# Patient Record
Sex: Female | Born: 1950 | Race: White | Hispanic: No | Marital: Married | Smoking: Former smoker
Health system: Southern US, Community
[De-identification: ages and names within clinical notes are randomized; demographics above are authoritative.]

## PROBLEM LIST (undated history)

## (undated) DIAGNOSIS — N200 Calculus of kidney: Secondary | ICD-10-CM

## (undated) DIAGNOSIS — K851 Biliary acute pancreatitis without necrosis or infection: Secondary | ICD-10-CM

## (undated) DIAGNOSIS — E669 Obesity, unspecified: Secondary | ICD-10-CM

## (undated) DIAGNOSIS — A159 Respiratory tuberculosis unspecified: Secondary | ICD-10-CM

## (undated) DIAGNOSIS — R011 Cardiac murmur, unspecified: Secondary | ICD-10-CM

## (undated) DIAGNOSIS — K219 Gastro-esophageal reflux disease without esophagitis: Secondary | ICD-10-CM

## (undated) DIAGNOSIS — I10 Essential (primary) hypertension: Secondary | ICD-10-CM

## (undated) DIAGNOSIS — L719 Rosacea, unspecified: Secondary | ICD-10-CM

## (undated) DIAGNOSIS — I499 Cardiac arrhythmia, unspecified: Secondary | ICD-10-CM

## (undated) DIAGNOSIS — T466X5A Adverse effect of antihyperlipidemic and antiarteriosclerotic drugs, initial encounter: Secondary | ICD-10-CM

## (undated) DIAGNOSIS — E785 Hyperlipidemia, unspecified: Secondary | ICD-10-CM

## (undated) DIAGNOSIS — H469 Unspecified optic neuritis: Secondary | ICD-10-CM

## (undated) HISTORY — PX: CHOLECYSTECTOMY: SHX55

## (undated) HISTORY — PX: EYE MUSCLE SURGERY: SHX370

## (undated) HISTORY — PX: ABDOMINAL HYSTERECTOMY: SHX81

## (undated) HISTORY — PX: OOPHORECTOMY: SHX86

---

## 1971-10-18 DIAGNOSIS — A159 Respiratory tuberculosis unspecified: Secondary | ICD-10-CM

## 1971-10-18 HISTORY — DX: Respiratory tuberculosis unspecified: A15.9

## 2011-08-03 ENCOUNTER — Ambulatory Visit: Payer: Self-pay | Admitting: Internal Medicine

## 2012-08-03 ENCOUNTER — Ambulatory Visit: Payer: Self-pay | Admitting: Internal Medicine

## 2013-07-30 ENCOUNTER — Emergency Department: Payer: Self-pay | Admitting: Emergency Medicine

## 2013-07-30 LAB — CBC WITH DIFFERENTIAL/PLATELET
Basophil #: 0 10*3/uL (ref 0.0–0.1)
Basophil %: 0.2 %
Eosinophil #: 0.4 10*3/uL (ref 0.0–0.7)
Lymphocyte #: 2.4 10*3/uL (ref 1.0–3.6)
Lymphocyte %: 29 %
Monocyte %: 7.7 %
Neutrophil #: 4.9 10*3/uL (ref 1.4–6.5)
Neutrophil %: 58.7 %

## 2013-07-30 LAB — URINALYSIS, COMPLETE
Bilirubin,UR: NEGATIVE
Blood: NEGATIVE
Glucose,UR: NEGATIVE mg/dL (ref 0–75)
Ketone: NEGATIVE
Leukocyte Esterase: NEGATIVE
Nitrite: NEGATIVE
Ph: 5 (ref 4.5–8.0)
Protein: NEGATIVE
RBC,UR: 2 /HPF (ref 0–5)
Specific Gravity: 1.017 (ref 1.003–1.030)
WBC UR: 1 /HPF (ref 0–5)

## 2013-07-30 LAB — CBC
HGB: 13.3 g/dL (ref 12.0–16.0)
MCH: 29.5 pg (ref 26.0–34.0)
MCHC: 33.8 g/dL (ref 32.0–36.0)
MCV: 87 fL (ref 80–100)
RBC: 4.52 10*6/uL (ref 3.80–5.20)
RDW: 13 % (ref 11.5–14.5)
WBC: 8.3 10*3/uL (ref 3.6–11.0)

## 2013-07-31 LAB — COMPREHENSIVE METABOLIC PANEL
Alkaline Phosphatase: 96 U/L (ref 50–136)
BUN: 15 mg/dL (ref 7–18)
Bilirubin,Total: 0.3 mg/dL (ref 0.2–1.0)
Chloride: 107 mmol/L (ref 98–107)
Co2: 26 mmol/L (ref 21–32)
Creatinine: 0.92 mg/dL (ref 0.60–1.30)
EGFR (African American): >60
EGFR (Non-African Amer.): >60
Glucose: 100 mg/dL — ABNORMAL HIGH (ref 65–99)
Osmolality: 284 (ref 275–301)
Potassium: 3.2 mmol/L — ABNORMAL LOW (ref 3.5–5.1)
SGOT(AST): 24 U/L (ref 15–37)
Sodium: 142 mmol/L (ref 136–145)

## 2013-08-01 LAB — URINE CULTURE

## 2013-08-05 ENCOUNTER — Ambulatory Visit: Payer: Self-pay | Admitting: Internal Medicine

## 2013-09-03 ENCOUNTER — Emergency Department: Payer: Self-pay | Admitting: Emergency Medicine

## 2013-09-03 LAB — URINALYSIS, COMPLETE
Bacteria: NONE SEEN
Blood: NEGATIVE
Glucose,UR: NEGATIVE mg/dL (ref 0–75)
Leukocyte Esterase: NEGATIVE
Ph: 7 (ref 4.5–8.0)
Protein: NEGATIVE
Specific Gravity: 1.013 (ref 1.003–1.030)
Squamous Epithelial: 1

## 2013-09-03 LAB — CBC WITH DIFFERENTIAL/PLATELET
Basophil #: 0.1 10*3/uL (ref 0.0–0.1)
Basophil %: 1.1 %
Eosinophil #: 0.1 10*3/uL (ref 0.0–0.7)
Eosinophil %: 1.7 %
HCT: 41.3 % (ref 35.0–47.0)
Lymphocyte %: 31.5 %
MCHC: 33.5 g/dL (ref 32.0–36.0)
Monocyte #: 0.4 x10 3/mm (ref 0.2–0.9)
Monocyte %: 6.6 %
Neutrophil #: 3.2 10*3/uL (ref 1.4–6.5)
Neutrophil %: 59.1 %
Platelet: 174 10*3/uL (ref 150–440)
RBC: 4.72 10*6/uL (ref 3.80–5.20)
RDW: 13.2 % (ref 11.5–14.5)
WBC: 5.4 10*3/uL (ref 3.6–11.0)

## 2013-09-03 LAB — BASIC METABOLIC PANEL
Anion Gap: 7 (ref 7–16)
BUN: 12 mg/dL (ref 7–18)
Calcium, Total: 9 mg/dL (ref 8.5–10.1)
Chloride: 108 mmol/L — ABNORMAL HIGH (ref 98–107)
Co2: 25 mmol/L (ref 21–32)
Creatinine: 0.93 mg/dL (ref 0.60–1.30)
EGFR (African American): >60
EGFR (Non-African Amer.): >60
Glucose: 125 mg/dL — ABNORMAL HIGH (ref 65–99)
Potassium: 3.8 mmol/L (ref 3.5–5.1)

## 2013-09-03 LAB — TROPONIN I
Troponin-I: <0.02 ng/mL
Troponin-I: <0.02 ng/mL

## 2014-07-13 ENCOUNTER — Emergency Department: Payer: Self-pay | Admitting: Emergency Medicine

## 2014-07-13 LAB — COMPREHENSIVE METABOLIC PANEL
ALK PHOS: 78 U/L
ALT: 42 U/L
Albumin: 3.4 g/dL (ref 3.4–5.0)
Anion Gap: 7 (ref 7–16)
BUN: 13 mg/dL (ref 7–18)
Bilirubin,Total: 0.4 mg/dL (ref 0.2–1.0)
CHLORIDE: 108 mmol/L — AB (ref 98–107)
CO2: 27 mmol/L (ref 21–32)
Calcium, Total: 8.7 mg/dL (ref 8.5–10.1)
Creatinine: 1 mg/dL (ref 0.60–1.30)
EGFR (African American): >60
EGFR (Non-African Amer.): 60 — ABNORMAL LOW
Glucose: 99 mg/dL (ref 65–99)
Osmolality: 283 (ref 275–301)
Potassium: 3.2 mmol/L — ABNORMAL LOW (ref 3.5–5.1)
SGOT(AST): 33 U/L (ref 15–37)
Sodium: 142 mmol/L (ref 136–145)
TOTAL PROTEIN: 7.1 g/dL (ref 6.4–8.2)

## 2014-07-13 LAB — CBC
HCT: 43.6 % (ref 35.0–47.0)
HGB: 13.9 g/dL (ref 12.0–16.0)
MCH: 28.2 pg (ref 26.0–34.0)
MCHC: 31.9 g/dL — AB (ref 32.0–36.0)
MCV: 88 fL (ref 80–100)
Platelet: 188 10*3/uL (ref 150–440)
RBC: 4.94 10*6/uL (ref 3.80–5.20)
RDW: 13.2 % (ref 11.5–14.5)
WBC: 5.9 10*3/uL (ref 3.6–11.0)

## 2014-07-13 LAB — URINALYSIS, COMPLETE
BACTERIA: NONE SEEN
Bilirubin,UR: NEGATIVE
Blood: NEGATIVE
Glucose,UR: NEGATIVE mg/dL (ref 0–75)
Ketone: NEGATIVE
Nitrite: NEGATIVE
PH: 6 (ref 4.5–8.0)
Protein: NEGATIVE
RBC,UR: <1 /HPF (ref 0–5)
SPECIFIC GRAVITY: 1.011 (ref 1.003–1.030)
SQUAMOUS EPITHELIAL: NONE SEEN

## 2014-07-13 LAB — TROPONIN I: Troponin-I: <0.02 ng/mL

## 2014-07-13 LAB — TSH: THYROID STIMULATING HORM: 1.26 u[IU]/mL

## 2014-08-18 ENCOUNTER — Ambulatory Visit: Payer: Self-pay | Admitting: Internal Medicine

## 2015-07-02 ENCOUNTER — Other Ambulatory Visit: Payer: Self-pay | Admitting: Internal Medicine

## 2015-07-02 DIAGNOSIS — Z1231 Encounter for screening mammogram for malignant neoplasm of breast: Secondary | ICD-10-CM

## 2015-08-20 ENCOUNTER — Ambulatory Visit
Admission: RE | Admit: 2015-08-20 | Discharge: 2015-08-20 | Disposition: A | Discharge status: Home / Self Care | Source: Ambulatory Visit | Attending: Internal Medicine | Admitting: Internal Medicine

## 2015-08-20 DIAGNOSIS — Z1231 Encounter for screening mammogram for malignant neoplasm of breast: Secondary | ICD-10-CM | POA: Insufficient documentation

## 2015-08-28 ENCOUNTER — Encounter: Payer: Self-pay | Admitting: *Deleted

## 2015-08-31 ENCOUNTER — Ambulatory Visit: Admitting: Anesthesiology

## 2015-08-31 ENCOUNTER — Encounter: Payer: Self-pay | Admitting: *Deleted

## 2015-08-31 ENCOUNTER — Encounter
Admission: RE | Disposition: A | Discharge status: Home / Self Care | Payer: Self-pay | Source: Ambulatory Visit | Attending: Unknown Physician Specialty

## 2015-08-31 ENCOUNTER — Ambulatory Visit
Admission: RE | Admit: 2015-08-31 | Discharge: 2015-08-31 | Disposition: A | Discharge status: Home / Self Care | Source: Ambulatory Visit | Attending: Unknown Physician Specialty | Admitting: Unknown Physician Specialty

## 2015-08-31 DIAGNOSIS — I10 Essential (primary) hypertension: Secondary | ICD-10-CM | POA: Insufficient documentation

## 2015-08-31 DIAGNOSIS — Z90710 Acquired absence of both cervix and uterus: Secondary | ICD-10-CM | POA: Diagnosis not present

## 2015-08-31 DIAGNOSIS — Z87891 Personal history of nicotine dependence: Secondary | ICD-10-CM | POA: Diagnosis not present

## 2015-08-31 DIAGNOSIS — E669 Obesity, unspecified: Secondary | ICD-10-CM | POA: Diagnosis not present

## 2015-08-31 DIAGNOSIS — Z79899 Other long term (current) drug therapy: Secondary | ICD-10-CM | POA: Diagnosis not present

## 2015-08-31 DIAGNOSIS — Z87442 Personal history of urinary calculi: Secondary | ICD-10-CM | POA: Diagnosis not present

## 2015-08-31 DIAGNOSIS — K219 Gastro-esophageal reflux disease without esophagitis: Secondary | ICD-10-CM | POA: Insufficient documentation

## 2015-08-31 DIAGNOSIS — K319 Disease of stomach and duodenum, unspecified: Secondary | ICD-10-CM | POA: Insufficient documentation

## 2015-08-31 DIAGNOSIS — Z9049 Acquired absence of other specified parts of digestive tract: Secondary | ICD-10-CM | POA: Diagnosis not present

## 2015-08-31 DIAGNOSIS — K640 First degree hemorrhoids: Secondary | ICD-10-CM | POA: Diagnosis not present

## 2015-08-31 DIAGNOSIS — H469 Unspecified optic neuritis: Secondary | ICD-10-CM | POA: Diagnosis not present

## 2015-08-31 DIAGNOSIS — Z7982 Long term (current) use of aspirin: Secondary | ICD-10-CM | POA: Diagnosis not present

## 2015-08-31 DIAGNOSIS — L719 Rosacea, unspecified: Secondary | ICD-10-CM | POA: Insufficient documentation

## 2015-08-31 DIAGNOSIS — D123 Benign neoplasm of transverse colon: Secondary | ICD-10-CM | POA: Diagnosis not present

## 2015-08-31 DIAGNOSIS — I499 Cardiac arrhythmia, unspecified: Secondary | ICD-10-CM | POA: Insufficient documentation

## 2015-08-31 DIAGNOSIS — D127 Benign neoplasm of rectosigmoid junction: Secondary | ICD-10-CM | POA: Insufficient documentation

## 2015-08-31 DIAGNOSIS — Z1211 Encounter for screening for malignant neoplasm of colon: Secondary | ICD-10-CM | POA: Insufficient documentation

## 2015-08-31 DIAGNOSIS — E785 Hyperlipidemia, unspecified: Secondary | ICD-10-CM | POA: Diagnosis not present

## 2015-08-31 DIAGNOSIS — R12 Heartburn: Secondary | ICD-10-CM | POA: Diagnosis not present

## 2015-08-31 HISTORY — PX: ESOPHAGOGASTRODUODENOSCOPY (EGD) WITH PROPOFOL: SHX5813

## 2015-08-31 HISTORY — DX: Hyperlipidemia, unspecified: E78.5

## 2015-08-31 HISTORY — PX: COLONOSCOPY WITH PROPOFOL: SHX5780

## 2015-08-31 HISTORY — DX: Rosacea, unspecified: L71.9

## 2015-08-31 HISTORY — DX: Biliary acute pancreatitis without necrosis or infection: K85.10

## 2015-08-31 HISTORY — DX: Obesity, unspecified: E66.9

## 2015-08-31 HISTORY — DX: Gastro-esophageal reflux disease without esophagitis: K21.9

## 2015-08-31 HISTORY — DX: Calculus of kidney: N20.0

## 2015-08-31 HISTORY — DX: Cardiac arrhythmia, unspecified: I49.9

## 2015-08-31 HISTORY — DX: Unspecified optic neuritis: H46.9

## 2015-08-31 HISTORY — DX: Essential (primary) hypertension: I10

## 2015-08-31 SURGERY — COLONOSCOPY WITH PROPOFOL
Anesthesia: General

## 2015-08-31 MED ORDER — LIDOCAINE HCL (CARDIAC) 20 MG/ML IV SOLN
INTRAVENOUS | Status: DC | PRN
  Administered 2015-08-31: 100 mg via INTRAVENOUS

## 2015-08-31 MED ORDER — PHENYLEPHRINE HCL 10 MG/ML IJ SOLN
INTRAMUSCULAR | Status: DC | PRN
  Administered 2015-08-31: 100 ug via INTRAVENOUS

## 2015-08-31 MED ORDER — PROPOFOL 10 MG/ML IV BOLUS
INTRAVENOUS | Status: DC | PRN
  Administered 2015-08-31: 60 mg via INTRAVENOUS

## 2015-08-31 MED ORDER — FENTANYL CITRATE (PF) 100 MCG/2ML IJ SOLN
INTRAMUSCULAR | Status: DC | PRN
  Administered 2015-08-31: 50 ug via INTRAVENOUS

## 2015-08-31 MED ORDER — SODIUM CHLORIDE 0.9 % IV SOLN: INTRAVENOUS | Status: DC

## 2015-08-31 MED ORDER — GLYCOPYRROLATE 0.2 MG/ML IJ SOLN
INTRAMUSCULAR | Status: DC | PRN
  Administered 2015-08-31: 0.2 mg via INTRAVENOUS

## 2015-08-31 MED ORDER — PROPOFOL 500 MG/50ML IV EMUL
INTRAVENOUS | Status: DC | PRN
  Administered 2015-08-31: 140 ug/kg/min via INTRAVENOUS

## 2015-08-31 MED ORDER — MIDAZOLAM HCL 5 MG/5ML IJ SOLN
INTRAMUSCULAR | Status: DC | PRN
  Administered 2015-08-31: 1.5 mg via INTRAVENOUS

## 2015-08-31 MED ORDER — SODIUM CHLORIDE 0.9 % IV SOLN
INTRAVENOUS | Status: DC
  Administered 2015-08-31: 1000 mL via INTRAVENOUS

## 2015-08-31 NOTE — Op Note (Signed)
[facility] Gastroenterology Patient Name: Tekara Wallgren Procedure Date: 08/31/2015 3:27 PM MRN: [id number] Account #: [id number] Date of Birth: Dec 13, 1950 Admit Type: Outpatient Age: 64 Room: [facility] ENDO ROOM 1 Gender: Female Note Status: Finalized Procedure:         Upper GI endoscopy Indications:       Heartburn, Gastro-esophageal reflux disease Providers:         Manya Silvas, MD Referring MD:      Leonie Douglas. Doy Hutching, MD (Referring MD) Medicines:         Propofol per Anesthesia Complications:     No immediate complications. Procedure:         Pre-Anesthesia Assessment:                    - After reviewing the risks and benefits, the patient was                     deemed in satisfactory condition to undergo the procedure.                    After obtaining informed consent, the endoscope was passed                     under direct vision. Throughout the procedure, the                     patient's blood pressure, pulse, and oxygen saturations                     were monitored continuously. The Endoscope was introduced                     through the mouth, and advanced to the second part of                     duodenum. The upper GI endoscopy was accomplished without                     difficulty. The patient tolerated the procedure well. Findings:      The examined esophagus was normal.      Diffuse moderately erythematous mucosa without bleeding was found in the       gastric antrum. Biopsies were taken with a cold forceps for histology.       Biopsies were taken with a cold forceps for Helicobacter pylori testing.      The examined duodenum was normal. Impression:        - Normal esophagus.                    - Erythematous mucosa in the antrum. Biopsied.                    - Normal examined duodenum. Recommendation:    - Await pathology results. Manya Silvas, MD 08/31/2015 3:44:38 PM This report has been signed electronically. Number of  Addenda: 0 Note Initiated On: 08/31/2015 3:27 PM      [facility]

## 2015-08-31 NOTE — Anesthesia Preprocedure Evaluation (Signed)
Anesthesia Evaluation  Patient identified by MRN, date of birth, ID band Patient awake    Reviewed: Allergy & Precautions, NPO status   Airway Mallampati: II       Dental no notable dental hx. (+) Teeth Intact   Pulmonary neg pulmonary ROS, former smoker,    Pulmonary exam normal        Cardiovascular hypertension, Pt. on medications and Pt. on home beta blockers  Rhythm:Regular Rate:Normal     Neuro/Psych    GI/Hepatic Neg liver ROS, GERD  ,  Endo/Other    Renal/GU      Musculoskeletal negative musculoskeletal ROS (+)   Abdominal (+) + obese,   Peds  Hematology negative hematology ROS (+)   Anesthesia Other Findings   Reproductive/Obstetrics                             Anesthesia Physical Anesthesia Plan  ASA: II  Anesthesia Plan: General   Post-op Pain Management:    Induction: Intravenous  Airway Management Planned: Nasal Cannula  Additional Equipment:   Intra-op Plan:   Post-operative Plan:   Informed Consent: I have reviewed the patients History and Physical, chart, labs and discussed the procedure including the risks, benefits and alternatives for the proposed anesthesia with the patient or authorized representative who has indicated his/her understanding and acceptance.     Plan Discussed with: CRNA  Anesthesia Plan Comments:         Anesthesia Quick Evaluation

## 2015-08-31 NOTE — H&P (Signed)
Primary Care Physician:  Idelle Crouch, MD Primary Gastroenterologist:  Dr. Vira Agar  Pre-Procedure History & Physical: HPI:  Lindsey Stevens is a 64 y.o. female is here for an endoscopy and colonoscopy.   Past Medical History  Diagnosis Date  . Hypertension   . Gallstone pancreatitis   . GERD (gastroesophageal reflux disease)   . Dysrhythmia   . Hyperlipidemia   . Nephrolithiasis   . Obesity   . Optic neuropathy   . Rosacea     Past Surgical History  Procedure Laterality Date  . Abdominal hysterectomy    . Cholecystectomy    . Oophorectomy      Prior to Admission medications   Medication Sig Start Date End Date Taking? Authorizing Provider  amLODipine (NORVASC) 5 MG tablet Take 5 mg by mouth daily.   Yes Historical Provider, MD  aspirin 81 MG tablet Take 81 mg by mouth daily.   Yes Historical Provider, MD  Azelaic Acid (FINACEA) 15 % cream Apply topically 2 (two) times daily. After skin is thoroughly washed and patted dry, gently but thoroughly massage a thin film of azelaic acid cream into the affected area twice daily, in the morning and evening.   Yes Historical Provider, MD  Calcium Carbonate-Vitamin D 500-125 MG-UNIT TABS Take by mouth.   Yes Historical Provider, MD  cetirizine (ZYRTEC) 10 MG tablet Take 10 mg by mouth daily.   Yes Historical Provider, MD  esomeprazole (NEXIUM) 40 MG capsule Take 40 mg by mouth daily at 12 noon.   Yes Historical Provider, MD  lisinopril (PRINIVIL,ZESTRIL) 20 MG tablet Take 20 mg by mouth daily.   Yes Historical Provider, MD  mometasone (NASONEX) 50 MCG/ACT nasal spray Place 2 sprays into the nose daily.   Yes Historical Provider, MD  nystatin (MYCOSTATIN) 100000 UNIT/ML suspension Take 5 mLs by mouth 4 (four) times daily.   Yes Historical Provider, MD  simvastatin (ZOCOR) 20 MG tablet Take 20 mg by mouth daily.   Yes Historical Provider, MD  ALPRAZolam (XANAX) 0.25 MG tablet Take 0.25 mg by mouth as directed.    Historical Provider, MD   doxycycline (VIBRAMYCIN) 100 MG capsule Take 100 mg by mouth 2 (two) times daily.    Historical Provider, MD  metoprolol tartrate (LOPRESSOR) 25 MG tablet Take 25 mg by mouth 2 (two) times daily.    Historical Provider, MD    Allergies as of 08/04/2015  . (Not on File)    Family History  Problem Relation Age of Onset  . Breast cancer Neg Hx     Social History   Social History  . Marital Status: Married    Spouse Name: N/A  . Number of Children: N/A  . Years of Education: N/A   Occupational History  . Not on file.   Social History Main Topics  . Smoking status: Former Research scientist (life sciences)  . Smokeless tobacco: Never Used  . Alcohol Use: No  . Drug Use: No  . Sexual Activity: Not on file   Other Topics Concern  . Not on file   Social History Narrative    Review of Systems: See HPI, otherwise negative ROS  Physical Exam: BP 144/69 mmHg  Pulse 88  Temp(Src) 98.3 F (36.8 C) (Oral)  Resp 20  Ht 5\' 3"  (1.6 m)  Wt 102.059 kg (225 lb)  BMI 39.87 kg/m2  SpO2 96% General:   Alert,  pleasant and cooperative in NAD Head:  Normocephalic and atraumatic. Neck:  Supple; no masses or thyromegaly. Lungs:  Clear throughout to auscultation.    Heart:  Regular rate and rhythm. Abdomen:  Soft, nontender and nondistended. Normal bowel sounds, without guarding, and without rebound.   Neurologic:  Alert and  oriented x4;  grossly normal neurologically.  Impression/Plan: Lindsey Stevens is here for an endoscopy and colonoscopy to be performed for screening colon and GERD  Risks, benefits, limitations, and alternatives regarding  endoscopy and colonoscopy have been reviewed with the patient.  Questions have been answered.  All parties agreeable.   Gaylyn Cheers, MD  08/31/2015, 3:34 PM

## 2015-08-31 NOTE — Op Note (Signed)
[facility] Gastroenterology Patient Name: Lindsey Stevens Procedure Date: 08/31/2015 3:27 PM MRN: [id number] Account #: [id number] Date of Birth: 02-26-1951 Admit Type: Outpatient Age: 64 Room: [facility] ENDO ROOM 1 Gender: Female Note Status: Finalized Procedure:         Colonoscopy Indications:       Screening for colorectal malignant neoplasm Providers:         Manya Silvas, MD Referring MD:      Leonie Douglas. Doy Hutching, MD (Referring MD) Medicines:         Propofol per Anesthesia Complications:     No immediate complications. Procedure:         Pre-Anesthesia Assessment:                    - After reviewing the risks and benefits, the patient was                     deemed in satisfactory condition to undergo the procedure.                    After obtaining informed consent, the colonoscope was                     passed under direct vision. Throughout the procedure, the                     patient's blood pressure, pulse, and oxygen saturations                     were monitored continuously. The Colonoscope was                     introduced through the anus and advanced to the the cecum,                     identified by appendiceal orifice and ileocecal valve. The                     colonoscopy was performed without difficulty. The patient                     tolerated the procedure well. The quality of the bowel                     preparation was good. Findings:      A 10 mm polyp was found in the transverse colon. The polyp was sessile.       The polyp was removed with a saline injection-lift technique using a hot       snare. Resection and retrieval were complete. Prevent delayed bleeding,       one hemostatic clip was successfully placed. There was no bleeding       during, and at the end, of the procedure.      A small polyp was found in the transverse colon. The polyp was sessile.       The polyp was removed with a hot snare. Resection and retrieval were        complete. 2 clips applied.      A diminutive polyp was found in the recto-sigmoid colon. The polyp was       sessile. The polyp was removed with a cold biopsy forceps. Resection and       retrieval were complete.      Internal hemorrhoids were  found during endoscopy. The hemorrhoids were       small and Grade I (internal hemorrhoids that do not prolapse). Impression:        - One 10 mm polyp in the transverse colon. Resected and                     retrieved. Clip was placed.                    - One small polyp in the transverse colon. Resected and                     retrieved.                    - One diminutive polyp at the recto-sigmoid colon.                     Resected and retrieved.                    - Internal hemorrhoids. Recommendation:    - Await pathology results. Manya Silvas, MD 08/31/2015 4:21:18 PM This report has been signed electronically. Number of Addenda: 0 Note Initiated On: 08/31/2015 3:27 PM Scope Withdrawal Time: 0 hours 26 minutes 51 seconds  Total Procedure Duration: 0 hours 30 minutes 6 seconds       [facility]

## 2015-08-31 NOTE — Transfer of Care (Signed)
Immediate Anesthesia Transfer of Care Note  Patient: Lindsey Stevens  Procedure(s) Performed: Procedure(s): COLONOSCOPY WITH PROPOFOL (N/A) ESOPHAGOGASTRODUODENOSCOPY (EGD) WITH PROPOFOL (N/A)  Patient Location: PACU  Anesthesia Type:General  Level of Consciousness: sedated  Airway & Oxygen Therapy: Patient Spontanous Breathing and Patient connected to nasal cannula oxygen  Post-op Assessment: Report given to RN and Post -op Vital signs reviewed and stable  Post vital signs: Reviewed and stable  Last Vitals:  Filed Vitals:   08/31/15 1625  BP: 92/70  Pulse: 78  Temp: 36.1 C  Resp: 17    Complications: No apparent anesthesia complications

## 2015-09-02 ENCOUNTER — Encounter: Payer: Self-pay | Admitting: Unknown Physician Specialty

## 2015-09-02 LAB — SURGICAL PATHOLOGY

## 2015-09-08 NOTE — Anesthesia Postprocedure Evaluation (Signed)
Anesthesia Post Note  Patient: Daiana Fedorchak Passero  Procedure(s) Performed: Procedure(s) (LRB): COLONOSCOPY WITH PROPOFOL (N/A) ESOPHAGOGASTRODUODENOSCOPY (EGD) WITH PROPOFOL (N/A)  Patient location during evaluation: Endoscopy Anesthesia Type: General Level of consciousness: awake and alert Pain management: pain level controlled Vital Signs Assessment: post-procedure vital signs reviewed and stable Respiratory status: spontaneous breathing, nonlabored ventilation, respiratory function stable and patient connected to nasal cannula oxygen Cardiovascular status: blood pressure returned to baseline and stable Postop Assessment: No signs of nausea or vomiting Anesthetic complications: no    Last Vitals:  Filed Vitals:   08/31/15 1645 08/31/15 1655  BP: 117/96 112/78  Pulse: 64 70  Temp:    Resp: 16 16    Last Pain: There were no vitals filed for this visit.               Molli Barrows

## 2016-07-05 ENCOUNTER — Other Ambulatory Visit: Payer: Self-pay | Admitting: Internal Medicine

## 2016-07-05 DIAGNOSIS — Z1231 Encounter for screening mammogram for malignant neoplasm of breast: Secondary | ICD-10-CM

## 2016-08-22 ENCOUNTER — Ambulatory Visit
Admission: RE | Admit: 2016-08-22 | Discharge: 2016-08-22 | Disposition: A | Discharge status: Home / Self Care | Source: Ambulatory Visit | Attending: Internal Medicine | Admitting: Internal Medicine

## 2016-08-22 DIAGNOSIS — Z1231 Encounter for screening mammogram for malignant neoplasm of breast: Secondary | ICD-10-CM | POA: Diagnosis present

## 2016-10-31 ENCOUNTER — Encounter: Payer: Self-pay | Admitting: *Deleted

## 2016-11-04 NOTE — Discharge Instructions (Signed)
Cataract Surgery, Care After °Refer to this sheet in the next few weeks. These instructions provide you with information about caring for yourself after your procedure. Your health care provider may also give you more specific instructions. Your treatment has been planned according to current medical practices, but problems sometimes occur. Call your health care provider if you have any problems or questions after your procedure. °What can I expect after the procedure? °After the procedure, it is common to have: °· Itching. °· Discomfort. °· Fluid discharge. °· Sensitivity to light and to touch. °· Bruising. °Follow these instructions at home: °Eye Care  °· Check your eye every day for signs of infection. Watch for: °¨ Redness, swelling, or pain. °¨ Fluid, blood, or pus. °¨ Warmth. °¨ Bad smell. °Activity  °· Avoid strenuous activities, such as playing contact sports, for as long as told by your health care provider. °· Do not drive or operate heavy machinery until your health care provider approves. °· Do not bend or lift heavy objects . Bending increases pressure in the eye. You can walk, climb stairs, and do light household chores. °· Ask your health care provider when you can return to work. If you work in a dusty environment, you may be advised to wear protective eyewear for a period of time. °General instructions  °· Take or apply over-the-counter and prescription medicines only as told by your health care provider. This includes eye drops. °· Do not touch or rub your eyes. °· If you were given a protective shield, wear it as told by your health care provider. If you were not given a protective shield, wear sunglasses as told by your health care provider to protect your eyes. °· Keep the area around your eye clean and dry. Avoid swimming or allowing water to hit you directly in the face while showering until told by your health care provider. Keep soap and shampoo out of your eyes. °· Do not put a contact lens  into the affected eye or eyes until your health care provider approves. °· Keep all follow-up visits as told by your health care provider. This is important. °Contact a health care provider if: ° °· You have increased bruising around your eye. °· You have pain that is not helped with medicine. °· You have a fever. °· You have redness, swelling, or pain in your eye. °· You have fluid, blood, or pus coming from your incision. °· Your vision gets worse. °Get help right away if: °· You have sudden vision loss. °This information is not intended to replace advice given to you by your health care provider. Make sure you discuss any questions you have with your health care provider. °Document Released: 04/22/2005 Document Revised: 02/11/2016 Document Reviewed: 08/13/2015 °Elsevier Interactive Patient Education © 2017 [employer] ° ° ° ° °General Anesthesia, Adult, Care After °These instructions provide you with information about caring for yourself after your procedure. Your health care provider may also give you more specific instructions. Your treatment has been planned according to current medical practices, but problems sometimes occur. Call your health care provider if you have any problems or questions after your procedure. °What can I expect after the procedure? °After the procedure, it is common to have: °· Vomiting. °· A sore throat. °· Mental slowness. °It is common to feel: °· Nauseous. °· Cold or shivery. °· Sleepy. °· Tired. °· Sore or achy, even in parts of your body where you did not have surgery. °Follow these instructions at   home: °For at least 24 hours after the procedure:  °· Do not: °¨ Participate in activities where you could fall or become injured. °¨ Drive. °¨ Use heavy machinery. °¨ Drink alcohol. °¨ Take sleeping pills or medicines that cause drowsiness. °¨ Make important decisions or sign legal documents. °¨ Take care of children on your own. °· Rest. °Eating and drinking  °· If you vomit, drink  water, juice, or soup when you can drink without vomiting. °· Drink enough fluid to keep your urine clear or pale yellow. °· Make sure you have little or no nausea before eating solid foods. °· Follow the diet recommended by your health care provider. °General instructions  °· Have a responsible adult stay with you until you are awake and alert. °· Return to your normal activities as told by your health care provider. Ask your health care provider what activities are safe for you. °· Take over-the-counter and prescription medicines only as told by your health care provider. °· If you smoke, do not smoke without supervision. °· Keep all follow-up visits as told by your health care provider. This is important. °Contact a health care provider if: °· You continue to have nausea or vomiting at home, and medicines are not helpful. °· You cannot drink fluids or start eating again. °· You cannot urinate after 8-12 hours. °· You develop a skin rash. °· You have fever. °· You have increasing redness at the site of your procedure. °Get help right away if: °· You have difficulty breathing. °· You have chest pain. °· You have unexpected bleeding. °· You feel that you are having a life-threatening or urgent problem. °This information is not intended to replace advice given to you by your health care provider. Make sure you discuss any questions you have with your health care provider. °Document Released: 01/09/2001 Document Revised: 03/07/2016 Document Reviewed: 09/17/2015 °Elsevier Interactive Patient Education © 2017 Else[employer] Inc[employer] ° °

## 2016-11-07 ENCOUNTER — Encounter
Admission: RE | Disposition: A | Discharge status: Home / Self Care | Payer: Self-pay | Source: Ambulatory Visit | Attending: Ophthalmology

## 2016-11-07 ENCOUNTER — Ambulatory Visit
Admission: RE | Admit: 2016-11-07 | Discharge: 2016-11-07 | Disposition: A | Discharge status: Home / Self Care | Source: Ambulatory Visit | Attending: Ophthalmology | Admitting: Ophthalmology

## 2016-11-07 ENCOUNTER — Ambulatory Visit: Admitting: Anesthesiology

## 2016-11-07 DIAGNOSIS — K219 Gastro-esophageal reflux disease without esophagitis: Secondary | ICD-10-CM | POA: Diagnosis not present

## 2016-11-07 DIAGNOSIS — Z87891 Personal history of nicotine dependence: Secondary | ICD-10-CM | POA: Insufficient documentation

## 2016-11-07 DIAGNOSIS — N200 Calculus of kidney: Secondary | ICD-10-CM | POA: Diagnosis not present

## 2016-11-07 DIAGNOSIS — I10 Essential (primary) hypertension: Secondary | ICD-10-CM | POA: Diagnosis not present

## 2016-11-07 DIAGNOSIS — H2511 Age-related nuclear cataract, right eye: Secondary | ICD-10-CM | POA: Diagnosis not present

## 2016-11-07 HISTORY — DX: Respiratory tuberculosis unspecified: A15.9

## 2016-11-07 HISTORY — DX: Cardiac murmur, unspecified: R01.1

## 2016-11-07 HISTORY — PX: CATARACT EXTRACTION W/PHACO: SHX586

## 2016-11-07 SURGERY — PHACOEMULSIFICATION, CATARACT, WITH IOL INSERTION
Anesthesia: Monitor Anesthesia Care | Site: Eye | Laterality: Right | Wound class: Clean

## 2016-11-07 MED ORDER — LIDOCAINE HCL (PF) 4 % IJ SOLN
INTRAMUSCULAR | Status: DC | PRN
  Administered 2016-11-07: 1 mL via OPHTHALMIC

## 2016-11-07 MED ORDER — MIDAZOLAM HCL 2 MG/2ML IJ SOLN
INTRAMUSCULAR | Status: DC | PRN
  Administered 2016-11-07: 2 mg via INTRAVENOUS

## 2016-11-07 MED ORDER — BRIMONIDINE TARTRATE-TIMOLOL 0.2-0.5 % OP SOLN
OPHTHALMIC | Status: DC | PRN
  Administered 2016-11-07: 1 [drp] via OPHTHALMIC

## 2016-11-07 MED ORDER — EPINEPHRINE PF 1 MG/ML IJ SOLN
INTRAOCULAR | Status: DC | PRN
  Administered 2016-11-07: 92 mL via OPHTHALMIC

## 2016-11-07 MED ORDER — ARMC OPHTHALMIC DILATING DROPS
1.0000 "application " | OPHTHALMIC | Status: DC | PRN
  Administered 2016-11-07 (×3): 1 via OPHTHALMIC

## 2016-11-07 MED ORDER — CEFUROXIME OPHTHALMIC INJECTION 1 MG/0.1 ML
INJECTION | OPHTHALMIC | Status: DC | PRN
  Administered 2016-11-07: .3 mL via OPHTHALMIC

## 2016-11-07 MED ORDER — MOXIFLOXACIN HCL 0.5 % OP SOLN
1.0000 [drp] | OPHTHALMIC | Status: DC | PRN
  Administered 2016-11-07 (×3): 1 [drp] via OPHTHALMIC

## 2016-11-07 MED ORDER — FENTANYL CITRATE (PF) 100 MCG/2ML IJ SOLN
INTRAMUSCULAR | Status: DC | PRN
  Administered 2016-11-07 (×2): 50 ug via INTRAVENOUS

## 2016-11-07 MED ORDER — NA HYALUR & NA CHOND-NA HYALUR 0.4-0.35 ML IO KIT
PACK | INTRAOCULAR | Status: DC | PRN
  Administered 2016-11-07: 1 mL via INTRAOCULAR

## 2016-11-07 SURGICAL SUPPLY — 23 items
APPLICATOR COTTON TIP 3IN (MISCELLANEOUS) ×3 IMPLANT
CANNULA ANT/CHMB 27GA (MISCELLANEOUS) ×3 IMPLANT
DISSECTOR HYDRO NUCLEUS 50X22 (MISCELLANEOUS) ×3 IMPLANT
GLOVE BIO SURGEON STRL SZ7 (GLOVE) ×3 IMPLANT
GLOVE SURG LX 6.5 MICRO (GLOVE) ×2
GLOVE SURG LX STRL 6.5 MICRO (GLOVE) ×1 IMPLANT
GOWN STRL REUS W/ TWL LRG LVL3 (GOWN DISPOSABLE) ×2 IMPLANT
GOWN STRL REUS W/TWL LRG LVL3 (GOWN DISPOSABLE) ×4
LENS IOL ACRSF IQ ULTRA 18.5 (Intraocular Lens) ×1 IMPLANT
LENS IOL ACRYSOF IQ 18.5 (Intraocular Lens) ×3 IMPLANT
MARKER SKIN DUAL TIP RULER LAB (MISCELLANEOUS) ×3 IMPLANT
PACK CATARACT BRASINGTON (MISCELLANEOUS) ×3 IMPLANT
PACK EYE AFTER SURG (MISCELLANEOUS) ×3 IMPLANT
PACK OPTHALMIC (MISCELLANEOUS) ×3 IMPLANT
RING MALYGIN 7.0 (MISCELLANEOUS) IMPLANT
SUT ETHILON 10-0 CS-B-6CS-B-6 (SUTURE)
SUT VICRYL  9 0 (SUTURE)
SUT VICRYL 9 0 (SUTURE) IMPLANT
SUTURE EHLN 10-0 CS-B-6CS-B-6 (SUTURE) IMPLANT
SYR TB 1ML LUER SLIP (SYRINGE) ×3 IMPLANT
WATER STERILE IRR 250ML POUR (IV SOLUTION) ×3 IMPLANT
WICK EYE OCUCEL (MISCELLANEOUS) IMPLANT
WIPE NON LINTING 3.25X3.25 (MISCELLANEOUS) ×3 IMPLANT

## 2016-11-07 NOTE — Transfer of Care (Signed)
Immediate Anesthesia Transfer of Care Note  Patient: Lindsey Stevens  Procedure(s) Performed: Procedure(s) with comments: CATARACT EXTRACTION PHACO AND INTRAOCULAR LENS PLACEMENT (IOC)  Right (Right) - right eye IVA topical  Patient Location: PACU  Anesthesia Type: MAC  Level of Consciousness: awake, alert  and patient cooperative  Airway and Oxygen Therapy: Patient Spontanous Breathing and Patient connected to supplemental oxygen  Post-op Assessment: Post-op Vital signs reviewed, Patient's Cardiovascular Status Stable, Respiratory Function Stable, Patent Airway and No signs of Nausea or vomiting  Post-op Vital Signs: Reviewed and stable  Complications: No apparent anesthesia complications

## 2016-11-07 NOTE — Anesthesia Postprocedure Evaluation (Signed)
Anesthesia Post Note  Patient: Lindsey Stevens  Procedure(s) Performed: Procedure(s) (LRB): CATARACT EXTRACTION PHACO AND INTRAOCULAR LENS PLACEMENT (IOC)  Right (Right)  Patient location during evaluation: PACU Anesthesia Type: MAC Level of consciousness: awake and alert Pain management: pain level controlled Vital Signs Assessment: post-procedure vital signs reviewed and stable Respiratory status: spontaneous breathing, nonlabored ventilation, respiratory function stable and patient connected to nasal cannula oxygen Cardiovascular status: stable and blood pressure returned to baseline Anesthetic complications: no    Alisa Graff

## 2016-11-07 NOTE — Anesthesia Procedure Notes (Signed)
Procedure Name: MAC Performed by: Mayme Genta Pre-anesthesia Checklist: Patient identified, Emergency Drugs available, Suction available, Timeout performed and Patient being monitored Patient Re-evaluated:Patient Re-evaluated prior to inductionOxygen Delivery Method: Nasal cannula Placement Confirmation: positive ETCO2

## 2016-11-07 NOTE — H&P (Signed)
H+P reviewed and is up to date, please see paper chart.  

## 2016-11-07 NOTE — Op Note (Signed)
Date of Surgery: 11/07/2016  PREOPERATIVE DIAGNOSES: Visually significant nuclear sclerotic cataract, right eye.  POSTOPERATIVE DIAGNOSES: Same  PROCEDURES PERFORMED: Cataract extraction with intraocular lens implant, right eye.  SURGEON: Almon Hercules, M.D.  ANESTHESIA: MAC and topical  IMPLANTS: [id number] +18.5 D  Implant Name Type Inv. Item Serial No. Manufacturer Lot No. LRB No. Used  LENS IOL ACRYSOF IQ 18.5 - [id number] Intraocular Lens LENS IOL ACRYSOF IQ 18.5 [id number] [employer]   Right 1     COMPLICATIONS: None.  DESCRIPTION OF PROCEDURE: Therapeutic options were discussed with the patient preoperatively, including a discussion of risks and benefits of surgery. Informed consent was obtained. An IOL-Master and immersion biometry were used to take the lens measurements, and a dilated fundus exam was performed within 6 months of the surgical date.  The patient was premedicated and brought to the operating room and placed on the operating table in the supine position. After adequate anesthesia, the patient was prepped and draped in the usual sterile ophthalmic fashion. A wire lid speculum was inserted and the microscope was positioned. A Superblade was used to create a paracentesis site at the limbus and a small amount of dilute preservative free lidocaine was instilled into the anterior chamber, followed by dispersive viscoelastic. A clear corneal incision was created temporally using a 2.4 mm keratome blade. Capsulorrhexis was then performed. In situ phacoemulsification was performed.  Cortical material was removed with the irrigation-aspiration unit. Dispersive viscoelastic was instilled to open the capsular bag. A posterior chamber intraocular lens with the specifications above was inserted and positioned. Irrigation-aspiration was used to remove all viscoelastic. Cefuroxime 1cc was instilled into the anterior chamber, and the corneal incision was checked and found to be water tight.  The eyelid speculum was removed.  The operative eye was covered with protective goggles after instilling 1 drop of Combigan. The patient tolerated the procedure well. There were no complications.

## 2016-11-07 NOTE — Anesthesia Preprocedure Evaluation (Signed)
Anesthesia Evaluation  Patient identified by MRN, date of birth, ID band Patient awake    Reviewed: Allergy & Precautions, H&P , NPO status , Patient's Chart, lab work & pertinent test results, reviewed documented beta blocker date and time   Airway Mallampati: II  TM Distance: >3 FB Neck ROM: full    Dental no notable dental hx.    Pulmonary neg pulmonary ROS, former smoker,    Pulmonary exam normal breath sounds clear to auscultation       Cardiovascular Exercise Tolerance: Good hypertension, + dysrhythmias (PVCs)  Rhythm:regular Rate:Normal     Neuro/Psych negative neurological ROS  negative psych ROS   GI/Hepatic Neg liver ROS, GERD  ,  Endo/Other  negative endocrine ROS  Renal/GU nephrolithiasis  negative genitourinary   Musculoskeletal   Abdominal   Peds  Hematology negative hematology ROS (+)   Anesthesia Other Findings   Reproductive/Obstetrics negative OB ROS                             Anesthesia Physical Anesthesia Plan  ASA: II  Anesthesia Plan: MAC   Post-op Pain Management:    Induction:   Airway Management Planned:   Additional Equipment:   Intra-op Plan:   Post-operative Plan:   Informed Consent: I have reviewed the patients History and Physical, chart, labs and discussed the procedure including the risks, benefits and alternatives for the proposed anesthesia with the patient or authorized representative who has indicated his/her understanding and acceptance.   Dental Advisory Given  Plan Discussed with: CRNA  Anesthesia Plan Comments:         Anesthesia Quick Evaluation

## 2016-11-08 ENCOUNTER — Encounter: Payer: Self-pay | Admitting: Ophthalmology

## 2017-05-05 NOTE — Discharge Instructions (Signed)

## 2017-05-08 ENCOUNTER — Ambulatory Visit: Admitting: Anesthesiology

## 2017-05-08 ENCOUNTER — Ambulatory Visit
Admission: RE | Admit: 2017-05-08 | Discharge: 2017-05-08 | Disposition: A | Discharge status: Home / Self Care | Source: Ambulatory Visit | Attending: Ophthalmology | Admitting: Ophthalmology

## 2017-05-08 ENCOUNTER — Encounter
Admission: RE | Disposition: A | Discharge status: Home / Self Care | Payer: Self-pay | Source: Ambulatory Visit | Attending: Ophthalmology

## 2017-05-08 DIAGNOSIS — Z87891 Personal history of nicotine dependence: Secondary | ICD-10-CM | POA: Diagnosis not present

## 2017-05-08 DIAGNOSIS — Z7982 Long term (current) use of aspirin: Secondary | ICD-10-CM | POA: Diagnosis not present

## 2017-05-08 DIAGNOSIS — H2512 Age-related nuclear cataract, left eye: Secondary | ICD-10-CM | POA: Diagnosis not present

## 2017-05-08 DIAGNOSIS — I10 Essential (primary) hypertension: Secondary | ICD-10-CM | POA: Insufficient documentation

## 2017-05-08 DIAGNOSIS — Z79899 Other long term (current) drug therapy: Secondary | ICD-10-CM | POA: Insufficient documentation

## 2017-05-08 DIAGNOSIS — K219 Gastro-esophageal reflux disease without esophagitis: Secondary | ICD-10-CM | POA: Diagnosis not present

## 2017-05-08 HISTORY — PX: CATARACT EXTRACTION W/PHACO: SHX586

## 2017-05-08 SURGERY — PHACOEMULSIFICATION, CATARACT, WITH IOL INSERTION
Anesthesia: Monitor Anesthesia Care | Laterality: Left | Wound class: Clean

## 2017-05-08 MED ORDER — MOXIFLOXACIN HCL 0.5 % OP SOLN
OPHTHALMIC | Status: DC | PRN
  Administered 2017-05-08: 0.2 mL via OPHTHALMIC

## 2017-05-08 MED ORDER — ARMC OPHTHALMIC DILATING DROPS
1.0000 "application " | OPHTHALMIC | Status: DC | PRN
  Administered 2017-05-08 (×3): 1 via OPHTHALMIC

## 2017-05-08 MED ORDER — LACTATED RINGERS IV SOLN: INTRAVENOUS | Status: DC

## 2017-05-08 MED ORDER — LIDOCAINE HCL (PF) 2 % IJ SOLN
INTRAOCULAR | Status: DC | PRN
  Administered 2017-05-08: 1 mL via INTRAOCULAR

## 2017-05-08 MED ORDER — EPINEPHRINE PF 1 MG/ML IJ SOLN
INTRAOCULAR | Status: DC | PRN
  Administered 2017-05-08: 72 mL via OPHTHALMIC

## 2017-05-08 MED ORDER — MIDAZOLAM HCL 2 MG/2ML IJ SOLN
INTRAMUSCULAR | Status: DC | PRN
  Administered 2017-05-08: 2 mg via INTRAVENOUS

## 2017-05-08 MED ORDER — ACETAMINOPHEN 160 MG/5ML PO SOLN: 325.0000 mg | ORAL | Status: DC | PRN

## 2017-05-08 MED ORDER — SODIUM HYALURONATE 23 MG/ML IO SOLN
INTRAOCULAR | Status: DC | PRN
  Administered 2017-05-08: 0.6 mL via INTRAOCULAR

## 2017-05-08 MED ORDER — ACETAMINOPHEN 325 MG PO TABS: 325.0000 mg | ORAL_TABLET | ORAL | Status: DC | PRN

## 2017-05-08 MED ORDER — FENTANYL CITRATE (PF) 100 MCG/2ML IJ SOLN
INTRAMUSCULAR | Status: DC | PRN
  Administered 2017-05-08 (×2): 50 ug via INTRAVENOUS

## 2017-05-08 MED ORDER — SODIUM HYALURONATE 10 MG/ML IO SOLN
INTRAOCULAR | Status: DC | PRN
  Administered 2017-05-08: 0.55 mL via INTRAOCULAR

## 2017-05-08 SURGICAL SUPPLY — 17 items
CANNULA ANT/CHMB 27GA (MISCELLANEOUS) ×2 IMPLANT
DISSECTOR HYDRO NUCLEUS 50X22 (MISCELLANEOUS) ×2 IMPLANT
GLOVE BIO SURGEON STRL SZ8 (GLOVE) ×2 IMPLANT
GLOVE SURG LX 7.5 STRW (GLOVE) ×1
GLOVE SURG LX STRL 7.5 STRW (GLOVE) ×1 IMPLANT
GOWN STRL REUS W/ TWL LRG LVL3 (GOWN DISPOSABLE) ×2 IMPLANT
GOWN STRL REUS W/TWL LRG LVL3 (GOWN DISPOSABLE) ×2
LENS IOL ACRSF IQ ULTRA 23.0 (Intraocular Lens) ×1 IMPLANT
LENS IOL ACRYSOF IQ 23.0 (Intraocular Lens) ×2 IMPLANT
MARKER SKIN DUAL TIP RULER LAB (MISCELLANEOUS) ×2 IMPLANT
PACK CATARACT (MISCELLANEOUS) ×2 IMPLANT
PACK DR. KING ARMS (PACKS) ×2 IMPLANT
PACK EYE AFTER SURG (MISCELLANEOUS) ×2 IMPLANT
SYR 3ML LL SCALE MARK (SYRINGE) ×2 IMPLANT
SYR TB 1ML LUER SLIP (SYRINGE) ×2 IMPLANT
WATER STERILE IRR 500ML POUR (IV SOLUTION) ×2 IMPLANT
WIPE NON LINTING 3.25X3.25 (MISCELLANEOUS) ×2 IMPLANT

## 2017-05-08 NOTE — Anesthesia Preprocedure Evaluation (Signed)
Anesthesia Evaluation  Patient identified by MRN, date of birth, ID band Patient awake    Reviewed: Allergy & Precautions, H&P , NPO status , Patient's Chart, lab work & pertinent test results, reviewed documented beta blocker date and time   Airway Mallampati: II  TM Distance: >3 FB Neck ROM: full    Dental no notable dental hx.    Pulmonary neg pulmonary ROS, former smoker,    Pulmonary exam normal breath sounds clear to auscultation       Cardiovascular Exercise Tolerance: Good hypertension, Normal cardiovascular exam+ dysrhythmias (PVCs)  Rhythm:regular Rate:Normal     Neuro/Psych negative neurological ROS  negative psych ROS   GI/Hepatic Neg liver ROS, GERD  ,  Endo/Other  negative endocrine ROS  Renal/GU Renal diseasenephrolithiasis  negative genitourinary   Musculoskeletal   Abdominal   Peds  Hematology negative hematology ROS (+)   Anesthesia Other Findings   Reproductive/Obstetrics negative OB ROS                             Anesthesia Physical  Anesthesia Plan  ASA: II  Anesthesia Plan: MAC   Post-op Pain Management:    Induction:   PONV Risk Score and Plan: 2 and Midazolam  Airway Management Planned:   Additional Equipment:   Intra-op Plan:   Post-operative Plan:   Informed Consent: I have reviewed the patients History and Physical, chart, labs and discussed the procedure including the risks, benefits and alternatives for the proposed anesthesia with the patient or authorized representative who has indicated his/her understanding and acceptance.   Dental Advisory Given  Plan Discussed with: CRNA  Anesthesia Plan Comments:         Anesthesia Quick Evaluation

## 2017-05-08 NOTE — H&P (Signed)
The History and Physical notes are on paper, have been signed, and are to be scanned.   I have examined the patient and there are no changes to the H&P.   Benay Pillow 05/08/2017 7:26 AM

## 2017-05-08 NOTE — Anesthesia Postprocedure Evaluation (Signed)
Anesthesia Post Note  Patient: Lindsey Stevens  Procedure(s) Performed: Procedure(s) (LRB): CATARACT EXTRACTION PHACO AND INTRAOCULAR LENS PLACEMENT (IOC)  Left (Left)  Patient location during evaluation: PACU Anesthesia Type: MAC Level of consciousness: awake and alert and oriented Pain management: satisfactory to patient Vital Signs Assessment: post-procedure vital signs reviewed and stable Respiratory status: spontaneous breathing, nonlabored ventilation and respiratory function stable Cardiovascular status: blood pressure returned to baseline and stable Postop Assessment: Adequate PO intake and No signs of nausea or vomiting Anesthetic complications: no    Raliegh Ip

## 2017-05-08 NOTE — Transfer of Care (Signed)
Immediate Anesthesia Transfer of Care Note  Patient: Lindsey Stevens Apt  Procedure(s) Performed: Procedure(s): CATARACT EXTRACTION PHACO AND INTRAOCULAR LENS PLACEMENT ([facility])  Left (Left)  Patient Location: PACU  Anesthesia Type: MAC  Level of Consciousness: awake, alert  and patient cooperative  Airway and Oxygen Therapy: Patient Spontanous Breathing and Patient connected to supplemental oxygen  Post-op Assessment: Post-op Vital signs reviewed, Patient's Cardiovascular Status Stable, Respiratory Function Stable, Patent Airway and No signs of Nausea or vomiting  Post-op Vital Signs: Reviewed and stable  Complications: No apparent anesthesia complications

## 2017-05-08 NOTE — Op Note (Signed)
OPERATIVE NOTE  Lindsey Stevens [id number] 05/08/2017   PREOPERATIVE DIAGNOSIS:  Nuclear sclerotic cataract left eye.  H25.12   POSTOPERATIVE DIAGNOSIS:    Nuclear sclerotic cataract left eye.     PROCEDURE:  Phacoemusification with posterior chamber intraocular lens placement of the left eye   LENS:   Implant Name Type Inv. Item Serial No. Manufacturer Lot No. LRB No. Used  LENS IOL ACRYSOF IQ 23.0 - [id number] Intraocular Lens LENS IOL ACRYSOF IQ 23.0 [id number] ALCON   Left 1       [id number] +23.0 D IOL   ULTRASOUND TIME: 0 minutes 19.1 seconds.  CDE 2.31   SURGEON:  Benay Pillow, MD, MPH   ANESTHESIA:  Topical with tetracaine drops augmented with 1% preservative-free intracameral lidocaine.  ESTIMATED BLOOD LOSS: <1 mL   COMPLICATIONS:  None.   DESCRIPTION OF PROCEDURE:  The patient was identified in the holding room and transported to the operating room and placed in the supine position under the operating microscope.  The left eye was identified as the operative eye and it was prepped and draped in the usual sterile ophthalmic fashion.   A 1.0 millimeter clear-corneal paracentesis was made at the 5:00 position. 0.5 ml of preservative-free 1% lidocaine with epinephrine was injected into the anterior chamber.  The anterior chamber was filled with Healon 5 viscoelastic.  A 2.4 millimeter keratome was used to make a near-clear corneal incision at the 2:00 position.  A curvilinear capsulorrhexis was made with a cystotome and capsulorrhexis forceps.  Balanced salt solution was used to hydrodissect and hydrodelineate the nucleus.   Phacoemulsification was then used in stop and chop fashion to remove the lens nucleus and epinucleus.  The remaining cortex was then removed using the irrigation and aspiration handpiece. Healon was then placed into the capsular bag to distend it for lens placement.  A lens was then injected into the capsular bag.  The remaining viscoelastic was aspirated.    Wounds were hydrated with balanced salt solution.  The anterior chamber was inflated to a physiologic pressure with balanced salt solution.  Intracameral vigamox 0.1 mL undiltued was injected into the eye and a drop placed onto the ocular surface.  No wound leaks were noted.  The patient was taken to the recovery room in stable condition without complications of anesthesia or surgery  Benay Pillow 05/08/2017, 8:05 AM

## 2017-05-08 NOTE — Anesthesia Procedure Notes (Signed)
Procedure Name: MAC Date/Time: 05/08/2017 7:33 AM Performed by: Janna Arch Pre-anesthesia Checklist: Patient identified, Emergency Drugs available, Suction available and Patient being monitored Patient Re-evaluated:Patient Re-evaluated prior to induction Oxygen Delivery Method: Nasal cannula

## 2017-05-09 ENCOUNTER — Encounter: Payer: Self-pay | Admitting: Ophthalmology

## 2017-07-05 ENCOUNTER — Other Ambulatory Visit: Payer: Self-pay

## 2017-07-05 ENCOUNTER — Other Ambulatory Visit: Payer: Self-pay | Admitting: Internal Medicine

## 2017-07-05 DIAGNOSIS — Z1231 Encounter for screening mammogram for malignant neoplasm of breast: Secondary | ICD-10-CM

## 2017-08-25 ENCOUNTER — Ambulatory Visit
Admission: RE | Admit: 2017-08-25 | Discharge: 2017-08-25 | Disposition: A | Discharge status: Home / Self Care | Payer: Medicare PPO | Source: Ambulatory Visit | Attending: Internal Medicine | Admitting: Internal Medicine

## 2017-08-25 DIAGNOSIS — Z1231 Encounter for screening mammogram for malignant neoplasm of breast: Secondary | ICD-10-CM | POA: Diagnosis present

## 2018-01-24 DIAGNOSIS — D239 Other benign neoplasm of skin, unspecified: Secondary | ICD-10-CM

## 2018-01-24 HISTORY — DX: Other benign neoplasm of skin, unspecified: D23.9

## 2018-07-20 ENCOUNTER — Other Ambulatory Visit: Payer: Self-pay | Admitting: Internal Medicine

## 2018-07-20 DIAGNOSIS — Z1231 Encounter for screening mammogram for malignant neoplasm of breast: Secondary | ICD-10-CM

## 2018-08-27 ENCOUNTER — Ambulatory Visit
Admission: RE | Admit: 2018-08-27 | Discharge: 2018-08-27 | Disposition: A | Discharge status: Home / Self Care | Payer: Medicare PPO | Source: Ambulatory Visit | Attending: Internal Medicine | Admitting: Internal Medicine

## 2018-08-27 DIAGNOSIS — Z1231 Encounter for screening mammogram for malignant neoplasm of breast: Secondary | ICD-10-CM | POA: Insufficient documentation

## 2018-10-22 ENCOUNTER — Ambulatory Visit: Payer: PPO | Admitting: Anesthesiology

## 2018-10-22 ENCOUNTER — Ambulatory Visit
Admission: RE | Admit: 2018-10-22 | Discharge: 2018-10-22 | Disposition: A | Discharge status: Home / Self Care | Payer: PPO | Attending: Unknown Physician Specialty | Admitting: Unknown Physician Specialty

## 2018-10-22 ENCOUNTER — Encounter: Payer: Self-pay | Admitting: Anesthesiology

## 2018-10-22 ENCOUNTER — Encounter
Admission: RE | Disposition: A | Discharge status: Home / Self Care | Payer: Self-pay | Source: Home / Self Care | Attending: Unknown Physician Specialty

## 2018-10-22 DIAGNOSIS — Z86010 Personal history of colonic polyps: Secondary | ICD-10-CM | POA: Insufficient documentation

## 2018-10-22 DIAGNOSIS — Z90710 Acquired absence of both cervix and uterus: Secondary | ICD-10-CM | POA: Diagnosis not present

## 2018-10-22 DIAGNOSIS — I10 Essential (primary) hypertension: Secondary | ICD-10-CM | POA: Insufficient documentation

## 2018-10-22 DIAGNOSIS — K648 Other hemorrhoids: Secondary | ICD-10-CM | POA: Diagnosis not present

## 2018-10-22 DIAGNOSIS — E669 Obesity, unspecified: Secondary | ICD-10-CM | POA: Diagnosis not present

## 2018-10-22 DIAGNOSIS — Z8611 Personal history of tuberculosis: Secondary | ICD-10-CM | POA: Diagnosis not present

## 2018-10-22 DIAGNOSIS — D122 Benign neoplasm of ascending colon: Secondary | ICD-10-CM | POA: Diagnosis not present

## 2018-10-22 DIAGNOSIS — Z87442 Personal history of urinary calculi: Secondary | ICD-10-CM | POA: Diagnosis not present

## 2018-10-22 DIAGNOSIS — Z9049 Acquired absence of other specified parts of digestive tract: Secondary | ICD-10-CM | POA: Diagnosis not present

## 2018-10-22 DIAGNOSIS — Z888 Allergy status to other drugs, medicaments and biological substances status: Secondary | ICD-10-CM | POA: Insufficient documentation

## 2018-10-22 DIAGNOSIS — Z882 Allergy status to sulfonamides status: Secondary | ICD-10-CM | POA: Diagnosis not present

## 2018-10-22 DIAGNOSIS — Z6838 Body mass index (BMI) 38.0-38.9, adult: Secondary | ICD-10-CM | POA: Diagnosis not present

## 2018-10-22 DIAGNOSIS — Z1211 Encounter for screening for malignant neoplasm of colon: Secondary | ICD-10-CM | POA: Diagnosis not present

## 2018-10-22 DIAGNOSIS — L719 Rosacea, unspecified: Secondary | ICD-10-CM | POA: Diagnosis not present

## 2018-10-22 DIAGNOSIS — K635 Polyp of colon: Secondary | ICD-10-CM | POA: Diagnosis not present

## 2018-10-22 DIAGNOSIS — K219 Gastro-esophageal reflux disease without esophagitis: Secondary | ICD-10-CM | POA: Diagnosis not present

## 2018-10-22 DIAGNOSIS — I493 Ventricular premature depolarization: Secondary | ICD-10-CM | POA: Diagnosis not present

## 2018-10-22 DIAGNOSIS — K640 First degree hemorrhoids: Secondary | ICD-10-CM | POA: Insufficient documentation

## 2018-10-22 DIAGNOSIS — Z881 Allergy status to other antibiotic agents status: Secondary | ICD-10-CM | POA: Diagnosis not present

## 2018-10-22 DIAGNOSIS — Z9842 Cataract extraction status, left eye: Secondary | ICD-10-CM | POA: Diagnosis not present

## 2018-10-22 DIAGNOSIS — Z7982 Long term (current) use of aspirin: Secondary | ICD-10-CM | POA: Diagnosis not present

## 2018-10-22 DIAGNOSIS — Z79899 Other long term (current) drug therapy: Secondary | ICD-10-CM | POA: Insufficient documentation

## 2018-10-22 DIAGNOSIS — Z87891 Personal history of nicotine dependence: Secondary | ICD-10-CM | POA: Diagnosis not present

## 2018-10-22 DIAGNOSIS — E785 Hyperlipidemia, unspecified: Secondary | ICD-10-CM | POA: Insufficient documentation

## 2018-10-22 DIAGNOSIS — Z9841 Cataract extraction status, right eye: Secondary | ICD-10-CM | POA: Diagnosis not present

## 2018-10-22 HISTORY — PX: COLONOSCOPY WITH PROPOFOL: SHX5780

## 2018-10-22 SURGERY — COLONOSCOPY WITH PROPOFOL
Anesthesia: General

## 2018-10-22 MED ORDER — MIDAZOLAM HCL 2 MG/2ML IJ SOLN
INTRAMUSCULAR | Status: DC | PRN
  Administered 2018-10-22: 2 mg via INTRAVENOUS

## 2018-10-22 MED ORDER — PROPOFOL 500 MG/50ML IV EMUL
INTRAVENOUS | Status: AC
  Filled 2018-10-22: qty 50

## 2018-10-22 MED ORDER — MIDAZOLAM HCL 2 MG/2ML IJ SOLN
INTRAMUSCULAR | Status: AC
  Filled 2018-10-22: qty 2

## 2018-10-22 MED ORDER — FENTANYL CITRATE (PF) 100 MCG/2ML IJ SOLN
INTRAMUSCULAR | Status: DC | PRN
  Administered 2018-10-22 (×2): 25 ug via INTRAVENOUS
  Administered 2018-10-22: 50 ug via INTRAVENOUS

## 2018-10-22 MED ORDER — FENTANYL CITRATE (PF) 100 MCG/2ML IJ SOLN
INTRAMUSCULAR | Status: AC
  Filled 2018-10-22: qty 2

## 2018-10-22 MED ORDER — SODIUM CHLORIDE 0.9 % IV SOLN
INTRAVENOUS | Status: DC
  Administered 2018-10-22: 1000 mL via INTRAVENOUS

## 2018-10-22 MED ORDER — SODIUM CHLORIDE 0.9 % IV SOLN: INTRAVENOUS | Status: DC

## 2018-10-22 MED ORDER — PROPOFOL 500 MG/50ML IV EMUL
INTRAVENOUS | Status: DC | PRN
  Administered 2018-10-22: 100 ug/kg/min via INTRAVENOUS

## 2018-10-22 NOTE — H&P (Signed)
Primary Care Physician:  Idelle Crouch, MD Primary Gastroenterologist:  Dr. Vira Agar  Pre-Procedure History & Physical: HPI:  Lindsey Stevens is a 68 y.o. female is here for an colonoscopy.  Pt has hx of colon polyps on 08/31/2015   Past Medical History:  Diagnosis Date  . Dysrhythmia    PVCs  . Gallstone pancreatitis   . GERD (gastroesophageal reflux disease)   . Heart murmur   . Hyperlipidemia   . Hypertension   . Nephrolithiasis   . Obesity   . Optic neuropathy   . Rosacea   . Tuberculosis 1973   treated    Past Surgical History:  Procedure Laterality Date  . ABDOMINAL HYSTERECTOMY    . CATARACT EXTRACTION W/PHACO Right 11/07/2016   Procedure: CATARACT EXTRACTION PHACO AND INTRAOCULAR LENS PLACEMENT ([place])  Right;  Surgeon: Ronnell Freshwater, MD;  Location: [place];  Service: Ophthalmology;  Laterality: Right;  right eye IVA topical  . CATARACT EXTRACTION W/PHACO Left 05/08/2017   Procedure: CATARACT EXTRACTION PHACO AND INTRAOCULAR LENS PLACEMENT ([place])  Left;  Surgeon: Eulogio Bear, MD;  Location: [place];  Service: Ophthalmology;  Laterality: Left;  . CESAREAN SECTION     x3  . CHOLECYSTECTOMY    . COLONOSCOPY WITH PROPOFOL N/A 08/31/2015   Procedure: COLONOSCOPY WITH PROPOFOL;  Surgeon: Manya Silvas, MD;  Location: [facility] ENDOSCOPY;  Service: Endoscopy;  Laterality: N/A;  . ESOPHAGOGASTRODUODENOSCOPY (EGD) WITH PROPOFOL N/A 08/31/2015   Procedure: ESOPHAGOGASTRODUODENOSCOPY (EGD) WITH PROPOFOL;  Surgeon: Manya Silvas, MD;  Location: [facility] ENDOSCOPY;  Service: Endoscopy;  Laterality: N/A;  . EYE MUSCLE SURGERY     as child  . OOPHORECTOMY      Prior to Admission medications   Medication Sig Start Date End Date Taking? Authorizing Provider  Adapalene (DIFFERIN) 0.3 % gel Apply topically at bedtime.   Yes [provider]  amLODipine (NORVASC) 5 MG tablet Take 5 mg by mouth daily.   Yes [provider]   Azelaic Acid (FINACEA) 15 % cream Apply topically 2 (two) times daily. After skin is thoroughly washed and patted dry, gently but thoroughly massage a thin film of azelaic acid cream into the affected area twice daily, in the morning and evening.   Yes [provider]  Calcium Carbonate-Vitamin D 500-125 MG-UNIT TABS Take by mouth.   Yes [provider]  Dapsone (ACZONE) 5 % topical gel Apply topically as needed.   Yes [provider]  fluticasone (FLONASE) 50 MCG/ACT nasal spray Place into both nostrils daily as needed for allergies or rhinitis.   Yes [provider]  lisinopril (PRINIVIL,ZESTRIL) 20 MG tablet Take 20 mg by mouth daily.   Yes [provider]  nystatin (MYCOSTATIN) 100000 UNIT/ML suspension Take 5 mLs by mouth 4 (four) times daily.   Yes [provider]  pantoprazole (PROTONIX) 40 MG tablet Take 40 mg by mouth daily.   Yes [provider]  Polyethyl Glycol-Propyl Glycol (SYSTANE OP) Apply to eye as needed.   Yes [provider]  Propylene Glycol-Glycerin (SOOTHE) 0.6-0.6 % SOLN Apply to eye 3 (three) times daily as needed.   Yes [provider]  sodium chloride (OCEAN) 0.65 % SOLN nasal spray Place 1 spray into both nostrils as needed for congestion.   Yes [provider]  aspirin 81 MG tablet Take 81 mg by mouth daily.    [provider]  cetirizine (ZYRTEC) 10 MG tablet Take 10 mg by mouth  daily.    [provider]  ranitidine (ZANTAC) 150 MG tablet Take 150 mg by mouth daily.    [provider]  simvastatin (ZOCOR) 20 MG tablet Take 20 mg by mouth daily.    [provider]    Allergies as of 08/31/2018 - Review Complete 05/08/2017  Allergen Reaction Noted  . Bactrim [sulfamethoxazole-trimethoprim] Hives 08/28/2015  . Hydrochlorothiazide Itching 08/28/2015  . Sulfa antibiotics Rash 08/28/2015    Family History  Problem Relation Age of Onset  .  Breast cancer Neg Hx     Social History   Socioeconomic History  . Marital status: Married    Spouse name: Not on file  . Number of children: Not on file  . Years of education: Not on file  . Highest education level: Not on file  Occupational History  . Not on file  Social Needs  . Financial resource strain: Not on file  . Food insecurity:    Worry: Not on file    Inability: Not on file  . Transportation needs:    Medical: Not on file    Non-medical: Not on file  Tobacco Use  . Smoking status: Former Smoker    Last attempt to quit: 10/17/1990    Years since quitting: 28.0  . Smokeless tobacco: Never Used  Substance and Sexual Activity  . Alcohol use: No  . Drug use: Never  . Sexual activity: Not on file  Lifestyle  . Physical activity:    Days per week: Not on file    Minutes per session: Not on file  . Stress: Not on file  Relationships  . Social connections:    Talks on phone: Not on file    Gets together: Not on file    Attends religious service: Not on file    Active member of club or organization: Not on file    Attends meetings of clubs or organizations: Not on file    Relationship status: Not on file  . Intimate partner violence:    Fear of current or ex partner: Not on file    Emotionally abused: Not on file    Physically abused: Not on file    Forced sexual activity: Not on file  Other Topics Concern  . Not on file  Social History Narrative  . Not on file    Review of Systems: See HPI, otherwise negative ROS  Physical Exam: BP 139/76   Pulse 83   Temp (!) 96.3 F (35.7 C) (Tympanic)   Resp 16   Ht 5\' 3"  (1.6 m)   Wt 98 kg   SpO2 99%   BMI 38.26 kg/m  General:   Alert,  pleasant and cooperative in NAD Head:  Normocephalic and atraumatic. Neck:  Supple; no masses or thyromegaly. Lungs:  Clear throughout to auscultation.    Heart:  Regular rate and rhythm. Abdomen:  Soft, nontender and nondistended. Normal bowel sounds, without guarding,  and without rebound.   Neurologic:  Alert and  oriented x4;  grossly normal neurologically.  Impression/Plan: Lindsey Stevens is here for an colonoscopy to be performed for [facility] colon polyps.  Risks, benefits, limitations, and alternatives regarding  colonoscopy have been reviewed with the patient.  Questions have been answered.  All parties agreeable.   Gaylyn Cheers, MD  10/22/2018, 12:44 PM

## 2018-10-22 NOTE — Op Note (Signed)
[facility]astroenter[facility] Patient Name: Lindsey Stevens Procedure Date: 10/22/2018 12:48 PM MRN: [id number] Account #: [id number] Date of Birth: [birth year] Admit Type: Outpatient Age: 68 Room: [facility] ENDO ROOM 1 Gender: Female Note Status: Finalized Procedure:            Colonoscopy Indications:          High risk colon cancer surveillance: Personal history                        of colonic polyps Providers:            Manya Silvas, MD Referring MD:         Leonie Douglas. Doy Hutching, MD (Referring MD) Medicines:            Propofol per Anesthesia Complications:        No immediate complications. Procedure:            Pre-Anesthesia Assessment:                       - After reviewing the risks and benefits, the patient                        was deemed in satisfactory condition to undergo the                        procedure.                       After obtaining informed consent, the colonoscope was                        passed under direct vision. Throughout the procedure,                        the patient's blood pressure, pulse, and oxygen                        saturations were monitored continuously. The                        Colonoscope was introduced through the anus and                        advanced to the the cecum, identified by appendiceal                        orifice and ileocecal valve. The colonoscopy was                        performed without difficulty. The patient tolerated the                        procedure well. The quality of the bowel preparation                        was adequate to identify polyps. Findings:      A diminutive polyp was found in the hepatic flexure. The polyp was       sessile. The polyp was removed with a jumbo cold forceps. Resection and       retrieval were complete.      Internal  hemorrhoids were found during endoscopy. The hemorrhoids were       small and Grade I (internal hemorrhoids that do not prolapse).  The exam was otherwise without abnormality. Impression:           - One diminutive polyp at the hepatic flexure, removed                        with a jumbo cold forceps. Resected and retrieved.                       - Internal hemorrhoids.                       - The examination was otherwise normal. Recommendation:       - Await pathology results. Manya Silvas, MD 10/22/2018 1:22:43 PM This report has been signed electronically. Number of Addenda: 0 Note Initiated On: 10/22/2018 12:48 PM Scope Withdrawal Time: 0 hours 10 minutes 8 seconds  Total Procedure Duration: 0 hours 20 minutes 10 seconds       [facility]

## 2018-10-22 NOTE — Anesthesia Procedure Notes (Signed)
Performed by: Cook-Martin, Lun Muro Pre-anesthesia Checklist: Patient identified, Emergency Drugs available, Suction available, Patient being monitored and Timeout performed Patient Re-evaluated:Patient Re-evaluated prior to induction Oxygen Delivery Method: Nasal cannula Preoxygenation: Pre-oxygenation with 100% oxygen Induction Type: IV induction Placement Confirmation: positive ETCO2 and CO2 detector       

## 2018-10-22 NOTE — Anesthesia Preprocedure Evaluation (Addendum)
Anesthesia Evaluation  Patient identified by MRN, date of birth, ID band Patient awake    Reviewed: Allergy & Precautions, NPO status , Patient's Chart, lab work & pertinent test results, reviewed documented beta blocker date and time   Airway Mallampati: III  TM Distance: >3 FB     Dental  (+) Chipped   Pulmonary former smoker,           Cardiovascular Exercise Tolerance: Good hypertension, Pt. on medications and On Medications + dysrhythmias + Valvular Problems/Murmurs      Neuro/Psych    GI/Hepatic GERD  Medicated,  Endo/Other    Renal/GU Renal disease     Musculoskeletal   Abdominal   Peds  Hematology   Anesthesia Other Findings EKG ok from 5 yrs.  Reproductive/Obstetrics                            Anesthesia Physical Anesthesia Plan  ASA: III  Anesthesia Plan: General   Post-op Pain Management:    Induction: Intravenous  PONV Risk Score and Plan:   Airway Management Planned:   Additional Equipment:   Intra-op Plan:   Post-operative Plan:   Informed Consent: I have reviewed the patients History and Physical, chart, labs and discussed the procedure including the risks, benefits and alternatives for the proposed anesthesia with the patient or authorized representative who has indicated his/her understanding and acceptance.     Plan Discussed with: CRNA  Anesthesia Plan Comments:         Anesthesia Quick Evaluation

## 2018-10-22 NOTE — Transfer of Care (Signed)
Immediate Anesthesia Transfer of Care Note  Patient: Lindsey Stevens  Procedure(s) Performed: COLONOSCOPY WITH PROPOFOL (N/A )  Patient Location: PACU  Anesthesia Type:General  Level of Consciousness: awake and sedated  Airway & Oxygen Therapy: Patient Spontanous Breathing and Patient connected to nasal cannula oxygen  Post-op Assessment: Report given to RN and Post -op Vital signs reviewed and stable  Post vital signs: Reviewed and stable  Last Vitals:  Vitals Value Taken Time  BP    Temp    Pulse    Resp    SpO2      Last Pain:  Vitals:   10/22/18 1235  TempSrc: Tympanic  PainSc: 0-No pain         Complications: No apparent anesthesia complications

## 2018-10-22 NOTE — Anesthesia Post-op Follow-up Note (Signed)
Anesthesia QCDR form completed.        

## 2018-10-23 NOTE — Anesthesia Postprocedure Evaluation (Signed)
Anesthesia Post Note  Patient: Lindsey Stevens  Procedure(s) Performed: COLONOSCOPY WITH PROPOFOL (N/A )  Patient location during evaluation: PACU Anesthesia Type: General Level of consciousness: awake and alert Pain management: pain level controlled Vital Signs Assessment: post-procedure vital signs reviewed and stable Respiratory status: spontaneous breathing, nonlabored ventilation, respiratory function stable and patient connected to nasal cannula oxygen Cardiovascular status: blood pressure returned to baseline and stable Postop Assessment: no apparent nausea or vomiting Anesthetic complications: no     Last Vitals:  Vitals:   10/22/18 1343 10/22/18 1353  BP: 108/76 120/69  Pulse: 70 69  Resp: 14 10  Temp:    SpO2: 95% 100%    Last Pain:  Vitals:   10/22/18 1353  TempSrc:   PainSc: 0-No pain                 Molli Barrows

## 2018-10-24 LAB — SURGICAL PATHOLOGY

## 2018-11-05 DIAGNOSIS — H04123 Dry eye syndrome of bilateral lacrimal glands: Secondary | ICD-10-CM | POA: Diagnosis not present

## 2019-01-09 DIAGNOSIS — R0789 Other chest pain: Secondary | ICD-10-CM | POA: Diagnosis not present

## 2019-01-09 DIAGNOSIS — K219 Gastro-esophageal reflux disease without esophagitis: Secondary | ICD-10-CM | POA: Diagnosis not present

## 2019-01-09 DIAGNOSIS — Z6841 Body Mass Index (BMI) 40.0 and over, adult: Secondary | ICD-10-CM | POA: Diagnosis not present

## 2019-01-09 DIAGNOSIS — Z0000 Encounter for general adult medical examination without abnormal findings: Secondary | ICD-10-CM | POA: Diagnosis not present

## 2019-01-09 DIAGNOSIS — Z79899 Other long term (current) drug therapy: Secondary | ICD-10-CM | POA: Diagnosis not present

## 2019-01-09 DIAGNOSIS — I10 Essential (primary) hypertension: Secondary | ICD-10-CM | POA: Diagnosis not present

## 2019-01-09 DIAGNOSIS — I471 Supraventricular tachycardia: Secondary | ICD-10-CM | POA: Diagnosis not present

## 2019-01-09 DIAGNOSIS — E782 Mixed hyperlipidemia: Secondary | ICD-10-CM | POA: Diagnosis not present

## 2019-01-09 DIAGNOSIS — R079 Chest pain, unspecified: Secondary | ICD-10-CM | POA: Diagnosis not present

## 2019-02-07 DIAGNOSIS — D485 Neoplasm of uncertain behavior of skin: Secondary | ICD-10-CM | POA: Diagnosis not present

## 2019-02-07 DIAGNOSIS — L918 Other hypertrophic disorders of the skin: Secondary | ICD-10-CM | POA: Diagnosis not present

## 2019-02-07 DIAGNOSIS — I781 Nevus, non-neoplastic: Secondary | ICD-10-CM | POA: Diagnosis not present

## 2019-02-07 DIAGNOSIS — L578 Other skin changes due to chronic exposure to nonionizing radiation: Secondary | ICD-10-CM | POA: Diagnosis not present

## 2019-02-07 DIAGNOSIS — L821 Other seborrheic keratosis: Secondary | ICD-10-CM | POA: Diagnosis not present

## 2019-02-07 DIAGNOSIS — L718 Other rosacea: Secondary | ICD-10-CM | POA: Diagnosis not present

## 2019-02-07 DIAGNOSIS — D1800 Hemangioma unspecified site: Secondary | ICD-10-CM | POA: Diagnosis not present

## 2019-02-07 DIAGNOSIS — I8393 Asymptomatic varicose veins of bilateral lower extremities: Secondary | ICD-10-CM | POA: Diagnosis not present

## 2019-02-07 DIAGNOSIS — L812 Freckles: Secondary | ICD-10-CM | POA: Diagnosis not present

## 2019-02-07 DIAGNOSIS — Z1283 Encounter for screening for malignant neoplasm of skin: Secondary | ICD-10-CM | POA: Diagnosis not present

## 2019-02-25 DIAGNOSIS — L309 Dermatitis, unspecified: Secondary | ICD-10-CM | POA: Diagnosis not present

## 2019-02-27 DIAGNOSIS — L239 Allergic contact dermatitis, unspecified cause: Secondary | ICD-10-CM | POA: Diagnosis not present

## 2019-03-04 DIAGNOSIS — L308 Other specified dermatitis: Secondary | ICD-10-CM | POA: Diagnosis not present

## 2019-03-04 DIAGNOSIS — L239 Allergic contact dermatitis, unspecified cause: Secondary | ICD-10-CM | POA: Diagnosis not present

## 2019-03-13 DIAGNOSIS — I10 Essential (primary) hypertension: Secondary | ICD-10-CM | POA: Diagnosis not present

## 2019-03-13 DIAGNOSIS — Z6841 Body Mass Index (BMI) 40.0 and over, adult: Secondary | ICD-10-CM | POA: Diagnosis not present

## 2019-03-13 DIAGNOSIS — I493 Ventricular premature depolarization: Secondary | ICD-10-CM | POA: Diagnosis not present

## 2019-03-13 DIAGNOSIS — I471 Supraventricular tachycardia: Secondary | ICD-10-CM | POA: Diagnosis not present

## 2019-03-13 DIAGNOSIS — E782 Mixed hyperlipidemia: Secondary | ICD-10-CM | POA: Diagnosis not present

## 2019-05-02 DIAGNOSIS — H04123 Dry eye syndrome of bilateral lacrimal glands: Secondary | ICD-10-CM | POA: Diagnosis not present

## 2019-05-14 DIAGNOSIS — M79672 Pain in left foot: Secondary | ICD-10-CM | POA: Diagnosis not present

## 2019-05-14 DIAGNOSIS — M898X9 Other specified disorders of bone, unspecified site: Secondary | ICD-10-CM | POA: Diagnosis not present

## 2019-05-14 DIAGNOSIS — M65872 Other synovitis and tenosynovitis, left ankle and foot: Secondary | ICD-10-CM | POA: Diagnosis not present

## 2019-05-14 DIAGNOSIS — M19072 Primary osteoarthritis, left ankle and foot: Secondary | ICD-10-CM | POA: Diagnosis not present

## 2019-06-03 DIAGNOSIS — L719 Rosacea, unspecified: Secondary | ICD-10-CM | POA: Diagnosis not present

## 2019-07-05 DIAGNOSIS — R21 Rash and other nonspecific skin eruption: Secondary | ICD-10-CM | POA: Diagnosis not present

## 2019-07-05 DIAGNOSIS — L255 Unspecified contact dermatitis due to plants, except food: Secondary | ICD-10-CM | POA: Diagnosis not present

## 2019-07-12 DIAGNOSIS — Z6841 Body Mass Index (BMI) 40.0 and over, adult: Secondary | ICD-10-CM | POA: Diagnosis not present

## 2019-07-12 DIAGNOSIS — E782 Mixed hyperlipidemia: Secondary | ICD-10-CM | POA: Diagnosis not present

## 2019-07-12 DIAGNOSIS — I471 Supraventricular tachycardia: Secondary | ICD-10-CM | POA: Diagnosis not present

## 2019-07-12 DIAGNOSIS — I10 Essential (primary) hypertension: Secondary | ICD-10-CM | POA: Diagnosis not present

## 2019-07-12 DIAGNOSIS — Z79899 Other long term (current) drug therapy: Secondary | ICD-10-CM | POA: Diagnosis not present

## 2019-07-12 DIAGNOSIS — R8290 Unspecified abnormal findings in urine: Secondary | ICD-10-CM | POA: Diagnosis not present

## 2019-07-12 DIAGNOSIS — Z1239 Encounter for other screening for malignant neoplasm of breast: Secondary | ICD-10-CM | POA: Diagnosis not present

## 2019-07-25 DIAGNOSIS — L239 Allergic contact dermatitis, unspecified cause: Secondary | ICD-10-CM | POA: Diagnosis not present

## 2019-07-29 ENCOUNTER — Other Ambulatory Visit: Payer: Self-pay | Admitting: Internal Medicine

## 2019-07-29 DIAGNOSIS — Z1231 Encounter for screening mammogram for malignant neoplasm of breast: Secondary | ICD-10-CM

## 2019-09-02 ENCOUNTER — Ambulatory Visit
Admission: RE | Admit: 2019-09-02 | Discharge: 2019-09-02 | Disposition: A | Discharge status: Home / Self Care | Payer: PPO | Source: Ambulatory Visit | Attending: Internal Medicine | Admitting: Internal Medicine

## 2019-09-02 DIAGNOSIS — Z1231 Encounter for screening mammogram for malignant neoplasm of breast: Secondary | ICD-10-CM | POA: Diagnosis not present

## 2019-12-25 DIAGNOSIS — J209 Acute bronchitis, unspecified: Secondary | ICD-10-CM | POA: Diagnosis not present

## 2019-12-25 DIAGNOSIS — J019 Acute sinusitis, unspecified: Secondary | ICD-10-CM | POA: Diagnosis not present

## 2019-12-25 DIAGNOSIS — B9689 Other specified bacterial agents as the cause of diseases classified elsewhere: Secondary | ICD-10-CM | POA: Diagnosis not present

## 2020-01-14 DIAGNOSIS — J45909 Unspecified asthma, uncomplicated: Secondary | ICD-10-CM | POA: Diagnosis not present

## 2020-01-14 DIAGNOSIS — J45901 Unspecified asthma with (acute) exacerbation: Secondary | ICD-10-CM | POA: Diagnosis not present

## 2020-02-11 DIAGNOSIS — R002 Palpitations: Secondary | ICD-10-CM | POA: Diagnosis not present

## 2020-02-11 DIAGNOSIS — Z79899 Other long term (current) drug therapy: Secondary | ICD-10-CM | POA: Diagnosis not present

## 2020-02-11 DIAGNOSIS — Z0000 Encounter for general adult medical examination without abnormal findings: Secondary | ICD-10-CM | POA: Diagnosis not present

## 2020-02-11 DIAGNOSIS — N761 Subacute and chronic vaginitis: Secondary | ICD-10-CM | POA: Diagnosis not present

## 2020-02-11 DIAGNOSIS — I10 Essential (primary) hypertension: Secondary | ICD-10-CM | POA: Diagnosis not present

## 2020-02-11 DIAGNOSIS — Z6841 Body Mass Index (BMI) 40.0 and over, adult: Secondary | ICD-10-CM | POA: Diagnosis not present

## 2020-02-11 DIAGNOSIS — Z13820 Encounter for screening for osteoporosis: Secondary | ICD-10-CM | POA: Diagnosis not present

## 2020-02-11 DIAGNOSIS — E782 Mixed hyperlipidemia: Secondary | ICD-10-CM | POA: Diagnosis not present

## 2020-02-11 DIAGNOSIS — I471 Supraventricular tachycardia: Secondary | ICD-10-CM | POA: Diagnosis not present

## 2020-02-17 DIAGNOSIS — M8588 Other specified disorders of bone density and structure, other site: Secondary | ICD-10-CM | POA: Diagnosis not present

## 2020-02-18 DIAGNOSIS — Z79899 Other long term (current) drug therapy: Secondary | ICD-10-CM | POA: Diagnosis not present

## 2020-02-18 DIAGNOSIS — E782 Mixed hyperlipidemia: Secondary | ICD-10-CM | POA: Diagnosis not present

## 2020-02-18 DIAGNOSIS — Z6841 Body Mass Index (BMI) 40.0 and over, adult: Secondary | ICD-10-CM | POA: Diagnosis not present

## 2020-02-18 DIAGNOSIS — I10 Essential (primary) hypertension: Secondary | ICD-10-CM | POA: Diagnosis not present

## 2020-03-12 DIAGNOSIS — Z6841 Body Mass Index (BMI) 40.0 and over, adult: Secondary | ICD-10-CM | POA: Diagnosis not present

## 2020-03-12 DIAGNOSIS — R002 Palpitations: Secondary | ICD-10-CM | POA: Diagnosis not present

## 2020-03-12 DIAGNOSIS — I471 Supraventricular tachycardia: Secondary | ICD-10-CM | POA: Diagnosis not present

## 2020-03-12 DIAGNOSIS — I493 Ventricular premature depolarization: Secondary | ICD-10-CM | POA: Diagnosis not present

## 2020-03-12 DIAGNOSIS — E782 Mixed hyperlipidemia: Secondary | ICD-10-CM | POA: Diagnosis not present

## 2020-03-12 DIAGNOSIS — I10 Essential (primary) hypertension: Secondary | ICD-10-CM | POA: Diagnosis not present

## 2020-03-17 DIAGNOSIS — R945 Abnormal results of liver function studies: Secondary | ICD-10-CM | POA: Diagnosis not present

## 2020-03-25 DIAGNOSIS — H26491 Other secondary cataract, right eye: Secondary | ICD-10-CM | POA: Diagnosis not present

## 2020-05-04 ENCOUNTER — Encounter: Payer: Self-pay | Admitting: Dermatology

## 2020-05-04 ENCOUNTER — Ambulatory Visit (INDEPENDENT_AMBULATORY_CARE_PROVIDER_SITE_OTHER): Payer: PPO | Admitting: Dermatology

## 2020-05-04 ENCOUNTER — Other Ambulatory Visit: Payer: Self-pay

## 2020-05-04 DIAGNOSIS — D1800 Hemangioma unspecified site: Secondary | ICD-10-CM

## 2020-05-04 DIAGNOSIS — L821 Other seborrheic keratosis: Secondary | ICD-10-CM | POA: Diagnosis not present

## 2020-05-04 DIAGNOSIS — I781 Nevus, non-neoplastic: Secondary | ICD-10-CM | POA: Diagnosis not present

## 2020-05-04 DIAGNOSIS — D229 Melanocytic nevi, unspecified: Secondary | ICD-10-CM

## 2020-05-04 DIAGNOSIS — L719 Rosacea, unspecified: Secondary | ICD-10-CM | POA: Diagnosis not present

## 2020-05-04 DIAGNOSIS — Z1283 Encounter for screening for malignant neoplasm of skin: Secondary | ICD-10-CM | POA: Diagnosis not present

## 2020-05-04 DIAGNOSIS — L814 Other melanin hyperpigmentation: Secondary | ICD-10-CM | POA: Diagnosis not present

## 2020-05-04 DIAGNOSIS — L304 Erythema intertrigo: Secondary | ICD-10-CM | POA: Diagnosis not present

## 2020-05-04 DIAGNOSIS — L918 Other hypertrophic disorders of the skin: Secondary | ICD-10-CM | POA: Diagnosis not present

## 2020-05-04 DIAGNOSIS — L578 Other skin changes due to chronic exposure to nonionizing radiation: Secondary | ICD-10-CM

## 2020-05-04 DIAGNOSIS — L820 Inflamed seborrheic keratosis: Secondary | ICD-10-CM | POA: Diagnosis not present

## 2020-05-04 MED ORDER — AZELAIC ACID 15 % EX GEL: CUTANEOUS | 11 refills | Status: AC

## 2020-05-04 MED ORDER — IVERMECTIN 1 % EX CREA: TOPICAL_CREAM | CUTANEOUS | 11 refills | Status: DC

## 2020-05-04 MED ORDER — ADAPALENE 0.3 % EX GEL: CUTANEOUS | 11 refills | Status: DC

## 2020-05-04 NOTE — Progress Notes (Addendum)
Follow-Up Visit   Subjective  Lindsey Stevens is a 69 y.o. female who presents for the following: TBSE.  Patient presents today for Annual TBSE. No area of concern at this time, does have skin tags on neck she would like to have removed. Patient has a h/o of Rosacea which she uses Metro Gel and [place].  Patient has not h/o skin Ca. The patient presents for Total-Body Skin Exam (TBSE) for skin cancer screening and mole check.  The following portions of the chart were reviewed this encounter and updated as appropriate:  Tobacco  Allergies  Meds  Problems  Med Hx  Surg Hx  Fam Hx     Review of Systems:  No other skin or systemic complaints except as noted in HPI or Assessment and Plan.  Objective  Well appearing patient in no apparent distress; mood and affect are within normal limits.  A full examination was performed including scalp, head, eyes, ears, nose, lips, neck, chest, axillae, abdomen, back, buttocks, bilateral upper extremities, bilateral lower extremities, hands, feet, fingers, toes, fingernails, and toenails. All findings within normal limits unless otherwise noted below.  Objective  Mid Face: Mid face erythema with telangiectasias +/- scattered inflammatory papules.   Objective  Neck x 5: Irritated Fleshy, skin-colored pedunculated papules.    Objective  Mid Chest and inframammory: Xerotic hyperpigmentation  Objective  Left  Cheek Zygoma x 2 (2), Right Zygoma Cheek x 3, Right inferior cheek x 1, Right lateral brow  x 1, Right temporal lateral canthus x 1 (6): Stuck-on, waxy, tan-brown papules and plaques -- Discussed benign etiology and prognosis.    Assessment & Plan    Rosacea Mid Face  Continue adapalene 0.3 apply to affected area on face daily at night  Continue Finacea After skin is thoroughly washed and patted dry, gently but thoroughly massage a thin film of azelaic acid cream into the affected area twice daily, in the morning and evening.    Continue Soolantra apply to affected area on face daily in morning  Continue Metro Gel as prescribed  Achrochordon Neck x 5  Snip removal today  Epidermal / dermal shaving - Neck x 5  Informed consent: discussed and consent obtained   Patient was prepped and draped in usual sterile fashion: Area prepped with alcohol. Anesthesia: the lesion was anesthetized in a standard fashion   Anesthetic:  1% lidocaine w/ epinephrine 1-100,000 buffered w/ 8.4% NaHCO3 Instrument used: scissors   Hemostasis achieved with: pressure, aluminum chloride and electrodesiccation   Outcome: patient tolerated procedure well   Post-procedure details: wound care instructions given    Erythema intertrigo Mid Chest and inframammory  Nystatin per Dr. Doy Hutching  Skin cancer screening  Seborrheic keratosis, inflamed (8) Left  Cheek Zygoma x 2 (2); Right Zygoma Cheek x 3, Right inferior cheek x 1, Right lateral brow  x 1, Right temporal lateral canthus x 1 (6)  Cryotherapy today Prior to procedure, discussed risks of blister formation, small wound, skin dyspigmentation, or rare scar following cryotherapy.    Destruction of lesion - Left  Cheek Zygoma x 2, Right Zygoma Cheek x 3, Right inferior cheek x 1, Right lateral brow  x 1, Right temporal lateral canthus x 1 Complexity: simple   Destruction method: cryotherapy   Informed consent: discussed and consent obtained   Timeout:  patient name, date of birth, surgical site, and procedure verified Lesion destroyed using liquid nitrogen: Yes   Region frozen until ice ball extended beyond lesion: Yes  Outcome: patient tolerated procedure well with no complications   Post-procedure details: wound care instructions given     Lentigines - Scattered tan macules - Discussed due to sun exposure - Benign, observe - Call for any changes  Seborrheic Keratoses - Stuck-on, waxy, tan-brown papules and plaques  - Discussed benign etiology and prognosis. -  Observe - Call for any changes  Melanocytic Nevi - Tan-brown and/or pink-flesh-colored symmetric macules and papules - Benign appearing on exam today - Observation - Call clinic for new or changing moles - Recommend daily use of broad spectrum spf 30+ sunscreen to sun-exposed areas.   Hemangiomas - Red papules - Discussed benign nature - Observe - Call for any changes  Actinic Damage - diffuse scaly erythematous macules with underlying dyspigmentation - Recommend daily broad spectrum sunscreen SPF 30+ to sun-exposed areas, reapply every 2 hours as needed.  - Call for new or changing lesions.  Telangiectasia - Dilated blood vessel - Benign appearing on exam - Call for changes  Skin cancer screening performed today.  Return in about 1 year (around 05/04/2021) for TBSE.  I, Donzetta Kohut, CMA, am acting as scribe for Sarina Ser, MD . Documentation: I have reviewed the above documentation for accuracy and completeness, and I agree with the above.  Sarina Ser, MD

## 2020-05-04 NOTE — Patient Instructions (Addendum)

## 2020-05-05 ENCOUNTER — Other Ambulatory Visit: Payer: Self-pay

## 2020-05-05 ENCOUNTER — Encounter: Payer: Self-pay | Admitting: Dermatology

## 2020-05-05 MED ORDER — FINACEA 15 % EX FOAM: CUTANEOUS | 6 refills | Status: DC

## 2020-06-11 DIAGNOSIS — H26491 Other secondary cataract, right eye: Secondary | ICD-10-CM | POA: Diagnosis not present

## 2020-07-30 DIAGNOSIS — M170 Bilateral primary osteoarthritis of knee: Secondary | ICD-10-CM | POA: Diagnosis not present

## 2020-08-12 ENCOUNTER — Other Ambulatory Visit: Payer: Self-pay | Admitting: Internal Medicine

## 2020-08-12 DIAGNOSIS — E782 Mixed hyperlipidemia: Secondary | ICD-10-CM | POA: Diagnosis not present

## 2020-08-12 DIAGNOSIS — Z1231 Encounter for screening mammogram for malignant neoplasm of breast: Secondary | ICD-10-CM | POA: Diagnosis not present

## 2020-08-12 DIAGNOSIS — Z6836 Body mass index (BMI) 36.0-36.9, adult: Secondary | ICD-10-CM | POA: Diagnosis not present

## 2020-08-12 DIAGNOSIS — I10 Essential (primary) hypertension: Secondary | ICD-10-CM | POA: Diagnosis not present

## 2020-08-12 DIAGNOSIS — R002 Palpitations: Secondary | ICD-10-CM | POA: Diagnosis not present

## 2020-08-12 DIAGNOSIS — Z79899 Other long term (current) drug therapy: Secondary | ICD-10-CM | POA: Diagnosis not present

## 2020-09-07 DIAGNOSIS — M79672 Pain in left foot: Secondary | ICD-10-CM | POA: Diagnosis not present

## 2020-09-22 ENCOUNTER — Ambulatory Visit
Admission: RE | Admit: 2020-09-22 | Discharge: 2020-09-22 | Disposition: A | Discharge status: Home / Self Care | Payer: PPO | Source: Ambulatory Visit | Attending: Internal Medicine | Admitting: Internal Medicine

## 2020-09-22 ENCOUNTER — Other Ambulatory Visit: Payer: Self-pay

## 2020-09-22 DIAGNOSIS — Z1231 Encounter for screening mammogram for malignant neoplasm of breast: Secondary | ICD-10-CM

## 2020-09-29 ENCOUNTER — Other Ambulatory Visit: Payer: Self-pay | Admitting: Internal Medicine

## 2020-09-29 DIAGNOSIS — N6489 Other specified disorders of breast: Secondary | ICD-10-CM

## 2020-09-29 DIAGNOSIS — R928 Other abnormal and inconclusive findings on diagnostic imaging of breast: Secondary | ICD-10-CM

## 2020-10-01 ENCOUNTER — Ambulatory Visit
Admission: RE | Admit: 2020-10-01 | Discharge: 2020-10-01 | Disposition: A | Discharge status: Home / Self Care | Payer: PPO | Source: Ambulatory Visit | Attending: Internal Medicine | Admitting: Internal Medicine

## 2020-10-01 ENCOUNTER — Other Ambulatory Visit: Payer: Self-pay

## 2020-10-01 DIAGNOSIS — N6489 Other specified disorders of breast: Secondary | ICD-10-CM

## 2020-10-01 DIAGNOSIS — R928 Other abnormal and inconclusive findings on diagnostic imaging of breast: Secondary | ICD-10-CM | POA: Diagnosis not present

## 2020-10-05 DIAGNOSIS — R399 Unspecified symptoms and signs involving the genitourinary system: Secondary | ICD-10-CM | POA: Diagnosis not present

## 2020-10-05 DIAGNOSIS — R8290 Unspecified abnormal findings in urine: Secondary | ICD-10-CM | POA: Diagnosis not present

## 2020-12-04 DIAGNOSIS — K1370 Unspecified lesions of oral mucosa: Secondary | ICD-10-CM | POA: Diagnosis not present

## 2021-02-16 DIAGNOSIS — Z79899 Other long term (current) drug therapy: Secondary | ICD-10-CM | POA: Diagnosis not present

## 2021-02-16 DIAGNOSIS — I10 Essential (primary) hypertension: Secondary | ICD-10-CM | POA: Diagnosis not present

## 2021-02-16 DIAGNOSIS — E782 Mixed hyperlipidemia: Secondary | ICD-10-CM | POA: Diagnosis not present

## 2021-02-16 DIAGNOSIS — Z0000 Encounter for general adult medical examination without abnormal findings: Secondary | ICD-10-CM | POA: Diagnosis not present

## 2021-02-16 DIAGNOSIS — R002 Palpitations: Secondary | ICD-10-CM | POA: Diagnosis not present

## 2021-02-16 DIAGNOSIS — Z6841 Body Mass Index (BMI) 40.0 and over, adult: Secondary | ICD-10-CM | POA: Diagnosis not present

## 2021-03-22 DIAGNOSIS — B9689 Other specified bacterial agents as the cause of diseases classified elsewhere: Secondary | ICD-10-CM | POA: Diagnosis not present

## 2021-03-22 DIAGNOSIS — J209 Acute bronchitis, unspecified: Secondary | ICD-10-CM | POA: Diagnosis not present

## 2021-03-22 DIAGNOSIS — H66003 Acute suppurative otitis media without spontaneous rupture of ear drum, bilateral: Secondary | ICD-10-CM | POA: Diagnosis not present

## 2021-03-22 DIAGNOSIS — J019 Acute sinusitis, unspecified: Secondary | ICD-10-CM | POA: Diagnosis not present

## 2021-05-05 ENCOUNTER — Other Ambulatory Visit: Payer: Self-pay

## 2021-05-05 ENCOUNTER — Ambulatory Visit (INDEPENDENT_AMBULATORY_CARE_PROVIDER_SITE_OTHER): Payer: PPO | Admitting: Dermatology

## 2021-05-05 DIAGNOSIS — Z1283 Encounter for screening for malignant neoplasm of skin: Secondary | ICD-10-CM

## 2021-05-05 DIAGNOSIS — L814 Other melanin hyperpigmentation: Secondary | ICD-10-CM

## 2021-05-05 DIAGNOSIS — L821 Other seborrheic keratosis: Secondary | ICD-10-CM | POA: Diagnosis not present

## 2021-05-05 DIAGNOSIS — D229 Melanocytic nevi, unspecified: Secondary | ICD-10-CM | POA: Diagnosis not present

## 2021-05-05 DIAGNOSIS — L719 Rosacea, unspecified: Secondary | ICD-10-CM | POA: Diagnosis not present

## 2021-05-05 DIAGNOSIS — L578 Other skin changes due to chronic exposure to nonionizing radiation: Secondary | ICD-10-CM | POA: Diagnosis not present

## 2021-05-05 DIAGNOSIS — D1800 Hemangioma unspecified site: Secondary | ICD-10-CM

## 2021-05-05 MED ORDER — ADAPALENE 0.3 % EX GEL: CUTANEOUS | 11 refills | Status: DC

## 2021-05-05 NOTE — Patient Instructions (Addendum)
Instructions for [facility] Medications  One or more of your medications was sent to the [facility] mail order compounding pharmacy. You will receive an email from them and can purchase the medicine through that link. It will then be mailed to your home at the address you confirmed. If for any reason you do not receive an email from them, please check your spam folder. If you still do not find the email, please let us know. [facility] phone number is [phone].   Melanoma ABCDEs  Melanoma is the most dangerous type of skin cancer, and is the leading cause of death from skin disease.  You are more likely to develop melanoma if you: Have light-colored skin, light-colored eyes, or red or blond hair Spend a lot of time in the sun Tan regularly, either outdoors or in a tanning bed Have had blistering sunburns, especially during childhood Have a close family member who has had a melanoma Have atypical moles or large birthmarks  Early detection of melanoma is key since treatment is typically straightforward and cure rates are extremely high if we catch it early.   The first sign of melanoma is often a change in a mole or a new dark spot.  The ABCDE system is a way of remembering the signs of melanoma.  A for asymmetry:  The two halves do not match. B for border:  The edges of the growth are irregular. C for color:  A mixture of colors are present instead of an even brown color. D for diameter:  Melanomas are usually (but not always) greater than 73mm - the size of a pencil eraser. E for evolution:  The spot keeps changing in size, shape, and color.  Please check your skin once per month between visits. You can use a small mirror in front and a large mirror behind you to keep an eye on the back side or your body.   If you see any new or changing lesions before your next follow-up, please call to schedule a visit.  Please continue daily skin protection including broad spectrum  sunscreen SPF 30+ to sun-exposed areas, reapplying every 2 hours as needed when you're outdoors.   Staying in the shade or wearing long sleeves, sun glasses (UVA+UVB protection) and wide brim hats (4-inch brim around the entire circumference of the hat) are also recommended for sun protection.         If you have any questions or concerns for your doctor, please call our main line at [phone] and press option 4 to reach your doctor's medical assistant. If no one answers, please leave a voicemail as directed and we will return your call as soon as possible. Messages left after 4 pm will be answered the following business day.   You may also send [place] a message via [place]. We typically respond to MyChart messages within 1-2 business days.  For prescription refills, please ask your pharmacy to contact our office. Our fax number is [phone].  If you have an urgent issue when the clinic is closed that cannot wait until the next business day, you can page your doctor at the number below.    Please note that while we do our best to be available for urgent issues outside of office hours, we are not available 24/7.   If you have an urgent issue and are unable to reach [place], you may choose to seek medical care at your doctor's office, retail clinic, urgent care center, or emergency  room.  If you have a medical emergency, please immediately call 911 or go to the emergency department.  Pager Numbers  - Dr. Nehemiah Massed: [phone]  - Dr. Laurence Ferrari: [phone]  - Dr. Nicole Kindred: [phone]  In the event of inclement weather, please call our main line at [phone] for an update on the status of any delays or closures.  Dermatology Medication Tips: Please keep the boxes that topical medications come in in order to help keep track of the instructions about where and how to use these. Pharmacies typically print the medication instructions only on the boxes and not directly on the medication tubes.    If your medication is too expensive, please contact our office at [phone] option 4 or send [place] a message through [place].   We are unable to tell what your co-pay for medications will be in advance as this is different depending on your insurance coverage. However, we may be able to find a substitute medication at lower cost or fill out paperwork to get insurance to cover a needed medication.   If a prior authorization is required to get your medication covered by your insurance company, please allow [place] 1-2 business days to complete this process.  Drug prices often vary depending on where the prescription is filled and some pharmacies may offer cheaper prices.  The website www.goodrx.com contains coupons for medications through different pharmacies. The prices here do not account for what the cost may be with help from insurance (it may be cheaper with your insurance), but the website can give you the price if you did not use any insurance.  - You can print the associated coupon and take it with your prescription to the pharmacy.  - You may also stop by our office during regular business hours and pick up a GoodRx coupon card.  - If you need your prescription sent electronically to a different pharmacy, notify our office through [facility] or by phone at [phone] option 4.

## 2021-05-05 NOTE — Progress Notes (Signed)
Follow-Up Visit   Subjective  Lindsey Stevens is a 70 y.o. female who presents for the following: Annual Exam (Patient here today for 1 year tbse. She denies any new concerns. She has a history of rosacea ). Patient here for full body skin exam and skin cancer screening. The patient presents for Total-Body Skin Exam (TBSE) for skin cancer screening and mole check.  Spider veins at legs treated with injections  The following portions of the chart were reviewed this encounter and updated as appropriate:  Tobacco  Allergies  Meds  Problems  Med Hx  Surg Hx  Fam Hx      Objective  Well appearing patient in no apparent distress; mood and affect are within normal limits.  A full examination was performed including scalp, head, eyes, ears, nose, lips, neck, chest, axillae, abdomen, back, buttocks, bilateral upper extremities, bilateral lower extremities, hands, feet, fingers, toes, fingernails, and toenails. All findings within normal limits unless otherwise noted below.  Head - Anterior (Face) Pinkness on cheeks   Assessment & Plan  Rosacea Head - Anterior (Face)  Rosacea is a chronic progressive skin condition usually affecting the face of adults, causing redness and/or acne bumps. It is treatable but not curable. It sometimes affects the eyes (ocular rosacea) as well. It may respond to topical and/or systemic medication and can flare with stress, sun exposure, alcohol, exercise and some foods.  Daily application of broad spectrum spf 30+ sunscreen to face is recommended to reduce flares.  Chronic condition with duration or expected duration over one year. Condition is bothersome to patient. Currently flared.  Continue adapalene 0.3 apply to affected area on face daily at night refills 11 sent to [facility] (Skin medicinal triple cream) Rosacea-OxymetazolineActive Rosacea triple cream Azelaic Acid: 15% Ivermectin: 1% Metronidazole: 1% Vehicle: Cream 30 g rf 4 - apply thin  layer of cream to affected areas of face twice daily, in morning and evening.   Consider oral doxy in future if not responding to treatment   D/c  Soolantra apply to affected area on face daily in morning D/c  [place] Gel as prescribed D/c Finacea After skin is thoroughly washed and patted dry, gently but thoroughly massage a thin film of azelaic acid cream into the affected area twice daily, in the morning and evening.  Discussed IPL treatment for rosacea in fall, recommend 3 treatments (6 weeks apart) at $350 a treatment session.   Will schedule 1 year tbse at next follow up in fall.   Adapalene (DIFFERIN) 0.3 % gel - Head - Anterior (Face) Apply to affected area on face daily at night  Skin cancer screening  Lentigines - Scattered tan macules - Due to sun exposure - Benign-appering, observe - Recommend daily broad spectrum sunscreen SPF 30+ to sun-exposed areas, reapply every 2 hours as needed. - Call for any changes  Seborrheic Keratoses - Stuck-on, waxy, tan-brown papules and/or plaques  - Benign-appearing - Discussed benign etiology and prognosis. - Observe - Call for any changes  Melanocytic Nevi - Tan-brown and/or pink-flesh-colored symmetric macules and papules - Benign appearing on exam today - Observation - Call clinic for new or changing moles - Recommend daily use of broad spectrum spf 30+ sunscreen to sun-exposed areas.   Hemangiomas - Red papules - Discussed benign nature - Observe - Call for any changes  Actinic Damage - Chronic condition, secondary to cumulative UV/sun exposure - diffuse scaly erythematous macules with underlying dyspigmentation - Recommend daily broad spectrum  sunscreen SPF 30+ to sun-exposed areas, reapply every 2 hours as needed.  - Staying in the shade or wearing long sleeves, sun glasses (UVA+UVB protection) and wide brim hats (4-inch brim around the entire circumference of the hat) are also recommended for sun protection.  - Call  for new or changing lesions.  Skin cancer screening performed today.  Return for ipl treatment in fall .  1 year for skin cancer screening I, Ruthell Rummage, CMA, am acting as scribe for Sarina Ser, MD.  Documentation: I have reviewed the above documentation for accuracy and completeness, and I agree with the above.  Sarina Ser, MD

## 2021-05-06 ENCOUNTER — Encounter: Payer: Self-pay | Admitting: Dermatology

## 2021-05-06 DIAGNOSIS — G479 Sleep disorder, unspecified: Secondary | ICD-10-CM | POA: Diagnosis not present

## 2021-05-06 DIAGNOSIS — I10 Essential (primary) hypertension: Secondary | ICD-10-CM | POA: Diagnosis not present

## 2021-05-06 DIAGNOSIS — R002 Palpitations: Secondary | ICD-10-CM | POA: Diagnosis not present

## 2021-05-06 DIAGNOSIS — Z6841 Body Mass Index (BMI) 40.0 and over, adult: Secondary | ICD-10-CM | POA: Diagnosis not present

## 2021-05-09 DIAGNOSIS — G4733 Obstructive sleep apnea (adult) (pediatric): Secondary | ICD-10-CM | POA: Diagnosis not present

## 2021-05-10 ENCOUNTER — Telehealth: Payer: Self-pay

## 2021-05-10 NOTE — Telephone Encounter (Signed)
Adapalene PA was denied to Rosacea diagnosis. Can be approved with Acne ICD code.

## 2021-05-11 NOTE — Telephone Encounter (Signed)
PA resubmitted today

## 2021-06-03 ENCOUNTER — Other Ambulatory Visit: Payer: Self-pay | Admitting: Internal Medicine

## 2021-06-03 DIAGNOSIS — E782 Mixed hyperlipidemia: Secondary | ICD-10-CM | POA: Diagnosis not present

## 2021-06-03 DIAGNOSIS — I10 Essential (primary) hypertension: Secondary | ICD-10-CM | POA: Diagnosis not present

## 2021-06-03 DIAGNOSIS — Z6841 Body Mass Index (BMI) 40.0 and over, adult: Secondary | ICD-10-CM | POA: Diagnosis not present

## 2021-06-03 DIAGNOSIS — I471 Supraventricular tachycardia: Secondary | ICD-10-CM | POA: Diagnosis not present

## 2021-06-03 DIAGNOSIS — Z1231 Encounter for screening mammogram for malignant neoplasm of breast: Secondary | ICD-10-CM

## 2021-06-03 DIAGNOSIS — Z79899 Other long term (current) drug therapy: Secondary | ICD-10-CM | POA: Diagnosis not present

## 2021-06-03 DIAGNOSIS — Z0000 Encounter for general adult medical examination without abnormal findings: Secondary | ICD-10-CM | POA: Diagnosis not present

## 2021-08-03 ENCOUNTER — Telehealth: Payer: Self-pay

## 2021-08-03 MED ORDER — VALACYCLOVIR HCL 500 MG PO TABS: 500.0000 mg | ORAL_TABLET | Freq: Two times a day (BID) | ORAL | 2 refills | Status: DC

## 2021-08-03 NOTE — Telephone Encounter (Signed)
Left pt message that for her scheduled BBL 08/05/21 we routinely start patients on valtrex 1 day prior to procedure since the procedure can potentially cause a flare.  Advised she should start Valtrex 500mg  1 po bid for 7 days starting 1 day prior to procedure.  Advised to call if she had any questions and the medication would be sent to [facility] on [address]sh

## 2021-08-05 ENCOUNTER — Ambulatory Visit (INDEPENDENT_AMBULATORY_CARE_PROVIDER_SITE_OTHER): Payer: Self-pay | Admitting: Dermatology

## 2021-08-05 ENCOUNTER — Other Ambulatory Visit: Payer: Self-pay

## 2021-08-05 DIAGNOSIS — L988 Other specified disorders of the skin and subcutaneous tissue: Secondary | ICD-10-CM

## 2021-08-05 NOTE — Patient Instructions (Signed)

## 2021-08-05 NOTE — Progress Notes (Signed)
   Follow-Up Visit   Subjective  Lindsey Stevens is a 70 y.o. female who presents for the following: Facial Elastosis (Pt here for laser treatment ).  The following portions of the chart were reviewed this encounter and updated as appropriate:   Tobacco  Allergies  Meds  Problems  Med Hx  Surg Hx  Fam Hx     Review of Systems:  No other skin or systemic complaints except as noted in HPI or Assessment and Plan.  Objective  Well appearing patient in no apparent distress; mood and affect are within normal limits.  A focused examination was performed including face. Relevant physical exam findings are noted in the Assessment and Plan.  face Telangectasia and brown macules                 Assessment & Plan  Elastosis of skin face   [place] BBL - 08/05/21 1400      Patient Details   Skin Type: I    Anesthestic Cream Applied: No    Photo Takes: Yes    Consent Signed: Yes      Treatment Details   Date: 08/05/21    Treatment #: 1    Area: face    Filter: 1st Pass;2nd Pass      1st Pass   Location: F    BBL j/cm2: 25    PW Msec Sec: 27    Cooling Temp: 20    Pulses: 53      2nd Pass   Location: F    BBL j/cm2: 12    PW Msec Sec: 10    Cooling Temp: 25    Pulses: 168      Everybody in treatment room wore eye protection during procedure today     We will take 3 treatments for best results   Photorejuvenation - face Prior to the procedure, the patient's past medical history, medications, allergies, and the rare but potential risks and complications were reviewed with the patient and a signed consent was obtained.  Pre and post treatment care was discussed and instructions provided.  Patient tolerated the procedure well.   Nancy Fetter avoidance was stressed. The patient will call with any problems, questions or concerns prior to their next appointment.  Return in about 2 months (around 10/05/2021) for Dec BBL and February BBL .  IMarye Round, CMA, am  acting as scribe for Sarina Ser, MD .  Documentation: I have reviewed the above documentation for accuracy and completeness, and I agree with the above.  Sarina Ser, MD

## 2021-08-10 ENCOUNTER — Encounter: Payer: Self-pay | Admitting: Dermatology

## 2021-08-24 DIAGNOSIS — I10 Essential (primary) hypertension: Secondary | ICD-10-CM | POA: Diagnosis not present

## 2021-08-24 DIAGNOSIS — E782 Mixed hyperlipidemia: Secondary | ICD-10-CM | POA: Diagnosis not present

## 2021-08-24 DIAGNOSIS — Z79899 Other long term (current) drug therapy: Secondary | ICD-10-CM | POA: Diagnosis not present

## 2021-08-31 DIAGNOSIS — K219 Gastro-esophageal reflux disease without esophagitis: Secondary | ICD-10-CM | POA: Diagnosis not present

## 2021-08-31 DIAGNOSIS — I10 Essential (primary) hypertension: Secondary | ICD-10-CM | POA: Diagnosis not present

## 2021-08-31 DIAGNOSIS — Z6841 Body Mass Index (BMI) 40.0 and over, adult: Secondary | ICD-10-CM | POA: Diagnosis not present

## 2021-08-31 DIAGNOSIS — I471 Supraventricular tachycardia: Secondary | ICD-10-CM | POA: Diagnosis not present

## 2021-08-31 DIAGNOSIS — Z79899 Other long term (current) drug therapy: Secondary | ICD-10-CM | POA: Diagnosis not present

## 2021-08-31 DIAGNOSIS — Z1231 Encounter for screening mammogram for malignant neoplasm of breast: Secondary | ICD-10-CM | POA: Diagnosis not present

## 2021-08-31 DIAGNOSIS — E782 Mixed hyperlipidemia: Secondary | ICD-10-CM | POA: Diagnosis not present

## 2021-09-23 ENCOUNTER — Other Ambulatory Visit: Payer: Self-pay

## 2021-09-23 ENCOUNTER — Ambulatory Visit
Admission: RE | Admit: 2021-09-23 | Discharge: 2021-09-23 | Disposition: A | Discharge status: Home / Self Care | Payer: PPO | Source: Ambulatory Visit | Attending: Internal Medicine | Admitting: Internal Medicine

## 2021-09-23 DIAGNOSIS — Z1231 Encounter for screening mammogram for malignant neoplasm of breast: Secondary | ICD-10-CM

## 2021-10-19 ENCOUNTER — Other Ambulatory Visit: Payer: Self-pay

## 2021-10-19 ENCOUNTER — Encounter: Payer: Self-pay | Admitting: Dermatology

## 2021-10-19 ENCOUNTER — Ambulatory Visit (INDEPENDENT_AMBULATORY_CARE_PROVIDER_SITE_OTHER): Payer: TRICARE For Life (TFL) | Admitting: Dermatology

## 2021-10-19 DIAGNOSIS — L578 Other skin changes due to chronic exposure to nonionizing radiation: Secondary | ICD-10-CM

## 2021-10-19 DIAGNOSIS — L814 Other melanin hyperpigmentation: Secondary | ICD-10-CM

## 2021-10-19 NOTE — Progress Notes (Signed)
° °  Follow-Up Visit   Subjective  Lindsey Stevens is a 71 y.o. female who presents for the following: Actinic damage and rosacea (Patient is here today for her second treatment of the face with BBL laser. ). Erythema has improved since first treatment.  The following portions of the chart were reviewed this encounter and updated as appropriate:   Tobacco   Allergies   Meds   Problems   Med Hx   Surg Hx   Fam Hx      Review of Systems:  No other skin or systemic complaints except as noted in HPI or Assessment and Plan.  Objective  Well appearing patient in no apparent distress; mood and affect are within normal limits.  A focused examination was performed including the face. Relevant physical exam findings are noted in the Assessment and Plan.  Face Actinic changes and brown macules.              Assessment & Plan  Actinic skin damage Face With lentigines  - since patient has had significant improvement in erythema plan to treat only the brown spots today.  Patient; doctor and assistant all had eye protection.   [place] BBL - 10/19/21 1600      Patient Details   Skin Type: II    Anesthestic Cream Applied: No    Photo Takes: Yes    Consent Signed: Yes    Improvement from Previous Treatment: Yes      Treatment Details   Date: 10/19/21    Treatment #: 2    Area: face    Filter: 1st Pass      1st Pass   Location: F    BBL j/cm2: 15    PW Msec Sec: 10    Cooling Temp: 25    Pulses: 163     (Set on Face; skin type I-II; Light brown) Used square crystal with guard. 1 pass - got moderate pink erythema. Pt is prophylaxed with oral Valacyclovir for fever blisters. Plan at least one more treatment - pt already scheduled.  Return for appointment as scheduled for BBL.  Luther Redo, CMA, am acting as scribe for Sarina Ser, MD . Documentation: I have reviewed the above documentation for accuracy and completeness, and I agree with the above.  Sarina Ser,  MD

## 2021-10-19 NOTE — Patient Instructions (Signed)

## 2021-10-20 ENCOUNTER — Telehealth: Payer: Self-pay

## 2021-10-20 NOTE — Telephone Encounter (Signed)
Patient states that she has experienced more swelling than her previous BBL treatment and she had a difficult time opening her R eye this morning. She also has some fluid filled blisters on the chin and R cheek.

## 2021-10-21 ENCOUNTER — Other Ambulatory Visit: Payer: Self-pay

## 2021-10-21 ENCOUNTER — Ambulatory Visit (INDEPENDENT_AMBULATORY_CARE_PROVIDER_SITE_OTHER): Payer: Medicare HMO | Admitting: Dermatology

## 2021-10-21 DIAGNOSIS — L719 Rosacea, unspecified: Secondary | ICD-10-CM

## 2021-10-21 MED ORDER — MUPIROCIN 2 % EX OINT: 1.0000 "application " | TOPICAL_OINTMENT | Freq: Every day | CUTANEOUS | 0 refills | Status: DC

## 2021-10-21 NOTE — Patient Instructions (Addendum)
Apply mupirocin ointment to any open wounds at face until healed.   Continue with Valtrex prescription as directed    If You Need Anything After Your Visit  If you have any questions or concerns for your doctor, please call our main line at [phone] and press option 4 to reach your doctor's medical assistant. If no one answers, please leave a voicemail as directed and we will return your call as soon as possible. Messages left after 4 pm will be answered the following business day.   You may also send [place] a message via [place]. We typically respond to MyChart messages within 1-2 business days.  For prescription refills, please ask your pharmacy to contact our office. Our fax number is [phone].  If you have an urgent issue when the clinic is closed that cannot wait until the next business day, you can page your doctor at the number below.    Please note that while we do our best to be available for urgent issues outside of office hours, we are not available 24/7.   If you have an urgent issue and are unable to reach [place], you may choose to seek medical care at your doctor's office, retail clinic, urgent care center, or emergency room.  If you have a medical emergency, please immediately call 911 or go to the emergency department.  Pager Numbers  - Dr. Nehemiah Massed: [phone]  - Dr. Laurence Ferrari: [phone]  - Dr. Nicole Kindred: [phone]  In the event of inclement weather, please call our main line at [phone] for an update on the status of any delays or closures.  Dermatology Medication Tips: Please keep the boxes that topical medications come in in order to help keep track of the instructions about where and how to use these. Pharmacies typically print the medication instructions only on the boxes and not directly on the medication tubes.   If your medication is too expensive, please contact our office at [phone] option 4 or send [place] a message through [place].   We are unable  to tell what your co-pay for medications will be in advance as this is different depending on your insurance coverage. However, we may be able to find a substitute medication at lower cost or fill out paperwork to get insurance to cover a needed medication.   If a prior authorization is required to get your medication covered by your insurance company, please allow [place] 1-2 business days to complete this process.  Drug prices often vary depending on where the prescription is filled and some pharmacies may offer cheaper prices.  The website www.goodrx.com contains coupons for medications through different pharmacies. The prices here do not account for what the cost may be with help from insurance (it may be cheaper with your insurance), but the website can give you the price if you did not use any insurance.  - You can print the associated coupon and take it with your prescription to the pharmacy.  - You may also stop by our office during regular business hours and pick up a GoodRx coupon card.  - If you need your prescription sent electronically to a different pharmacy, notify our office through [facility] or by phone at [phone] option 4.     Si Usted Necesita Algo Despus de Su Visita  Tambin puede enviarnos un mensaje a travs de Pharmacist, community. Por lo general respondemos a los mensajes de MyChart en el transcurso de 1 a 2 das hbiles.  Para renovar recetas, por favor  pida a su farmacia que se ponga en contacto con nuestra oficina. Harland Dingwall de fax es [place] [phone].  Si tiene un asunto urgente cuando la clnica est cerrada y que no puede esperar hasta el siguiente da hbil, puede llamar/localizar a su doctor(a) al nmero que aparece a continuacin.   Por favor, tenga en cuenta que aunque hacemos todo lo posible para estar disponibles para asuntos urgentes fuera del horario de [place], no estamos disponibles las 24 horas del da, los 7 das de la [place].   Si tiene un  problema urgente y no puede comunicarse con nosotros, puede optar por buscar atencin mdica  en el consultorio de su doctor(a), en una clnica privada, en un centro de atencin urgente o en una sala de emergencias.  Si tiene Engineering geologist, por favor llame inmediatamente al 911 o vaya a la sala de emergencias.  Nmeros de bper  - Dr. Nehemiah Massed: [phone]  - Dra. Moye: [phone]  - Dra. Nicole Kindred: [phone]  En caso de inclemencias del [place], por favor llame a Johnsie Kindred principal al [phone] para una actualizacin sobre el [place] de cualquier retraso o cierre.  Consejos para la medicacin en dermatologa: Por favor, guarde las cajas en las que vienen los medicamentos de uso tpico para ayudarle a seguir las instrucciones sobre dnde y cmo usarlos. Las farmacias generalmente imprimen las instrucciones del medicamento slo en las cajas y no directamente en los tubos del [place].   Si su medicamento es muy caro, por favor, pngase en contacto con Zigmund Daniel llamando al [phone] y presione la opcin 4 o envenos un mensaje a travs de Pharmacist, community.   No podemos decirle cul ser su copago por los medicamentos por adelantado ya que esto es diferente dependiendo de la cobertura de su seguro. Sin embargo, es posible que podamos encontrar un medicamento sustituto a Electrical engineer un formulario para que el seguro cubra el medicamento que se considera necesario.   Si se requiere una autorizacin previa para que su compaa de seguros [place] su medicamento, por favor permtanos de 1 a 2 das hbiles para completar este proceso.  Los precios de los medicamentos varan con frecuencia dependiendo del Environmental consultant de dnde se surte la receta y alguna farmacias pueden ofrecer precios ms baratos.  El sitio web www.goodrx.com tiene cupones para medicamentos de Airline pilot. Los precios aqu no tienen en cuenta lo que podra costar con la ayuda del seguro (puede ser ms  barato con su seguro), pero el sitio web puede darle el precio si no utiliz Research scientist (physical sciences).  - Puede imprimir el cupn correspondiente y llevarlo con su receta a la farmacia.  - Tambin puede pasar por nuestra oficina durante el horario de atencin regular y Charity fundraiser una tarjeta de cupones de GoodRx.  - Si necesita que su receta se enve electrnicamente a una farmacia diferente, informe a nuestra oficina a travs de MyChart de [place] o por telfono llamando al [phone] y presione la opcin 4.

## 2021-10-21 NOTE — Progress Notes (Signed)
° °  Follow-Up Visit   Subjective  Lindsey Stevens is a 71 y.o. female who presents for the following: Follow-up (Patient here today for follow up on BBL done on Tuesday. Reports swelling and blistering at face a day after procedure. Patient has been using ice packs to help aleve some discomfort from swelling.  ).  The following portions of the chart were reviewed this encounter and updated as appropriate:  Tobacco   Allergies   Meds   Problems   Med Hx   Surg Hx   Fam Hx      Review of Systems: No other skin or systemic complaints except as noted in HPI or Assessment and Plan.  Objective  Well appearing patient in no apparent distress; mood and affect are within normal limits.  A focused examination was performed including face. Relevant physical exam findings are noted in the Assessment and Plan.  Head - Anterior (Face) Swelling and crusty patches on the face. No evidence of infection.   Assessment & Plan  Rosacea Head - Anterior (Face)  Status post 2 day f/u from bbl treatment.  Vigorous reaction but no problematic concerns.  Advised patient to use mupirocin to any open areas and continuing valacyclovir for at least a week for fever blister prevention.  Rosacea is a chronic progressive skin condition usually affecting the face of adults, causing redness and/or acne bumps. It is treatable but not curable. It sometimes affects the eyes (ocular rosacea) as well. It may respond to topical and/or systemic medication and can flare with stress, sun exposure, alcohol, exercise and some foods.  Daily application of broad spectrum spf 30+ sunscreen to face is recommended to reduce flares.  mupirocin ointment (BACTROBAN) 2 % - Head - Anterior (Face) Apply 1 application topically daily. Apply to any open wounds at face until healed.  Return for keep follow up as scheduled in february. IRuthell Rummage, CMA, am acting as scribe for Sarina Ser, MD. Documentation: I have reviewed the above  documentation for accuracy and completeness, and I agree with the above.  Sarina Ser, MD

## 2021-10-23 IMAGING — MG DIGITAL SCREENING BILAT W/ TOMO W/ CAD
8 series · 8 of 24 positions shown · non-contrast
Comparison: Previous exam(s).

CLINICAL DATA: Screening.

EXAM:
DIGITAL SCREENING BILATERAL MAMMOGRAM WITH TOMO AND CAD

[R CC synth-2D]
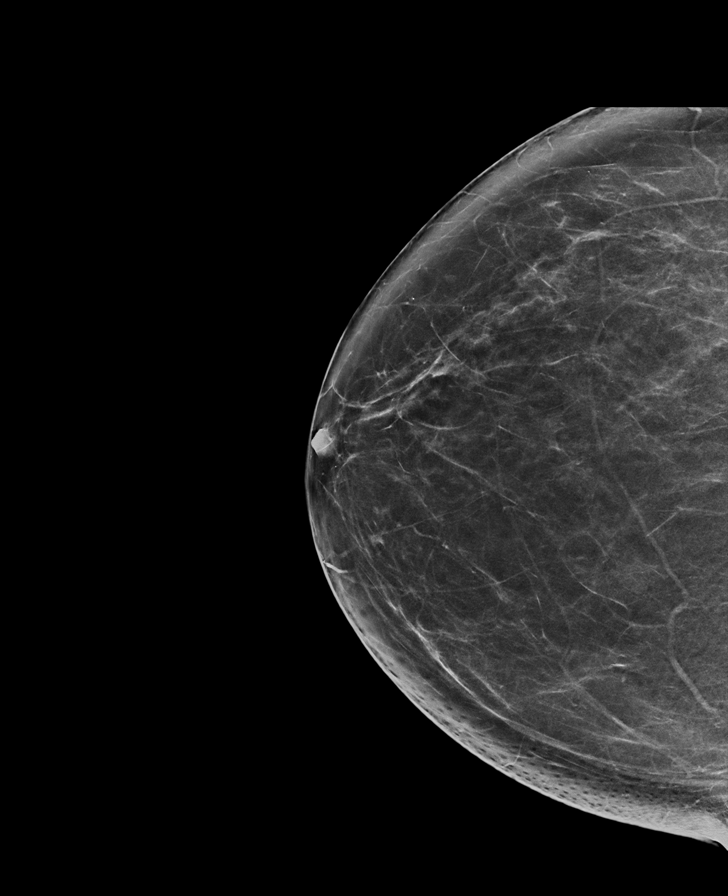

[R MLO synth-2D]
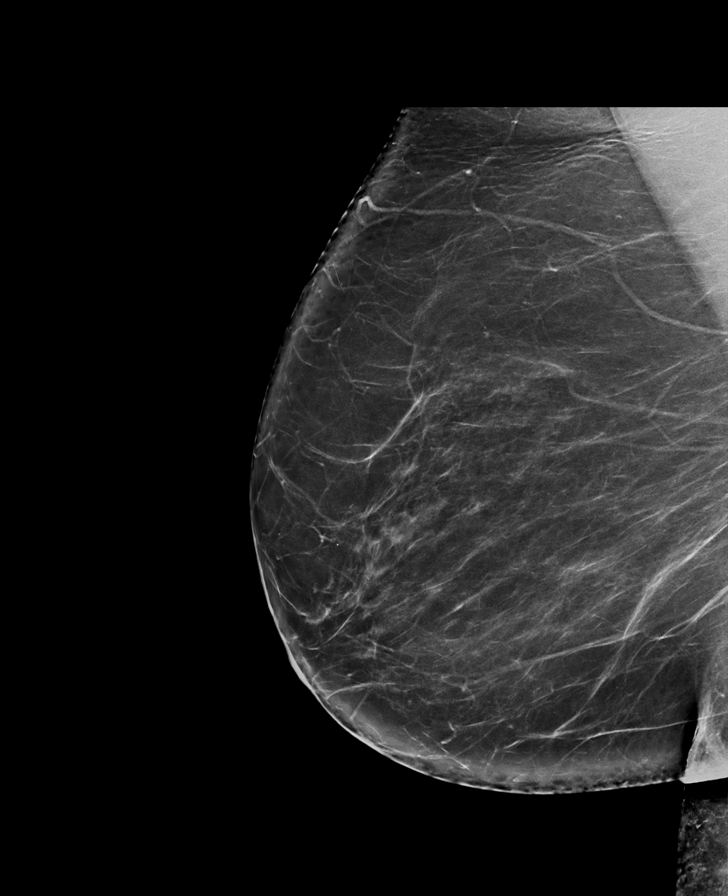

[L CC synth-2D]
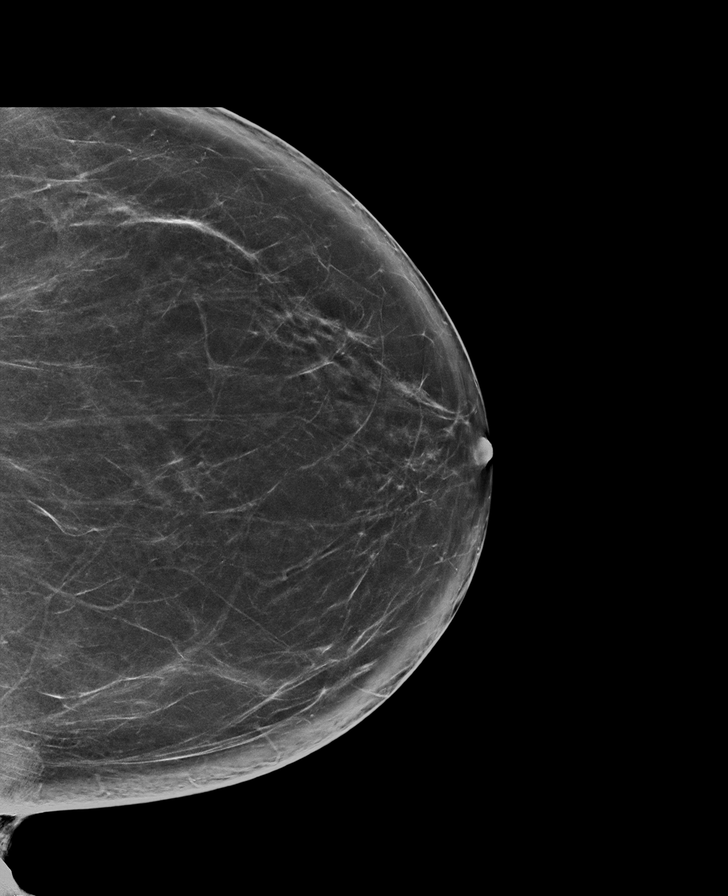

[L MLO synth-2D]
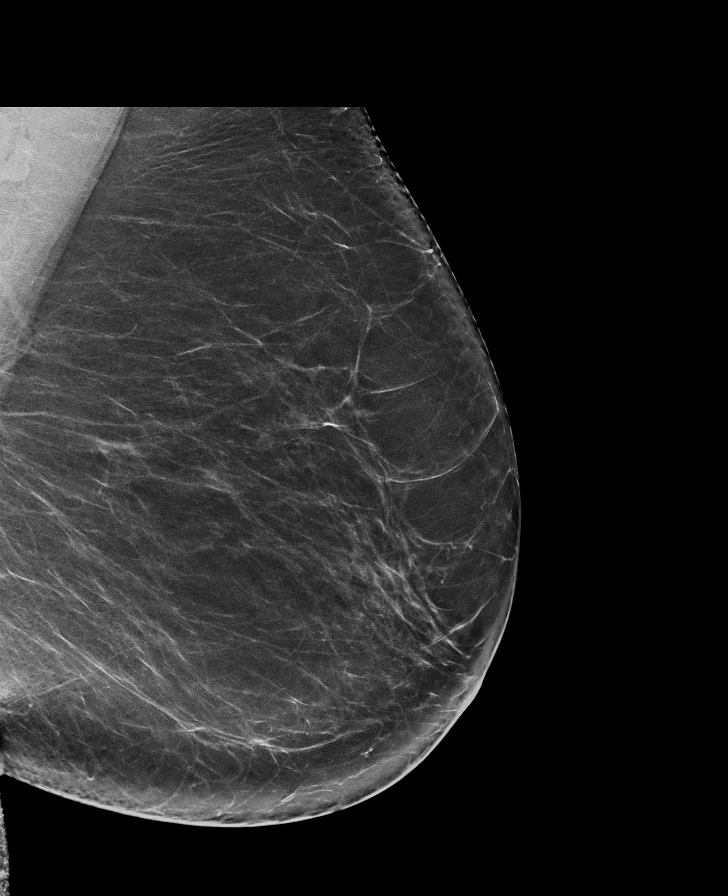

[R MLO tomo · tomo slice 47/93.0]
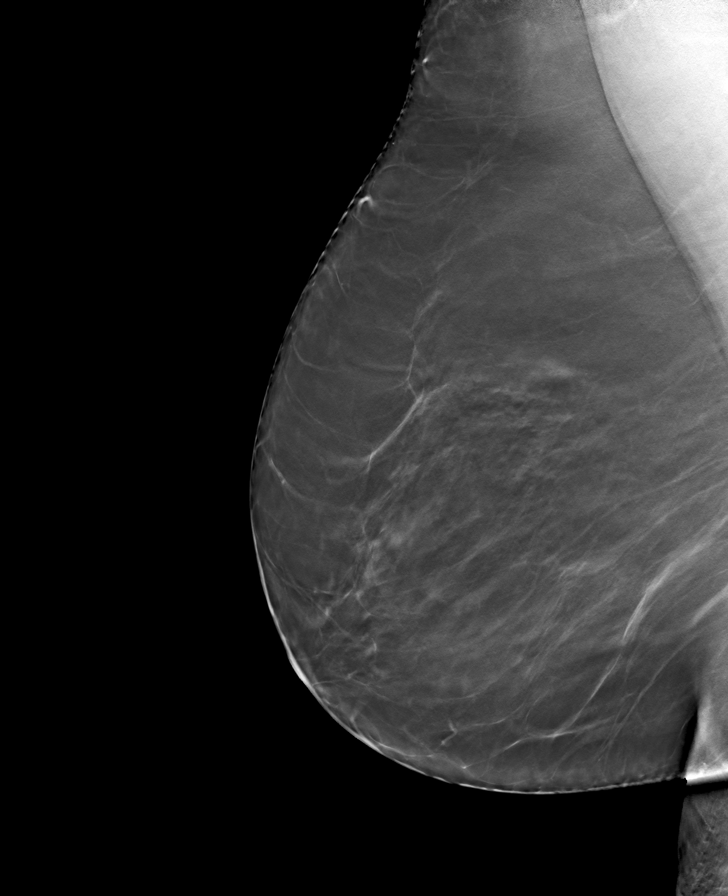

[L MLO tomo · tomo slice 47/92.0]
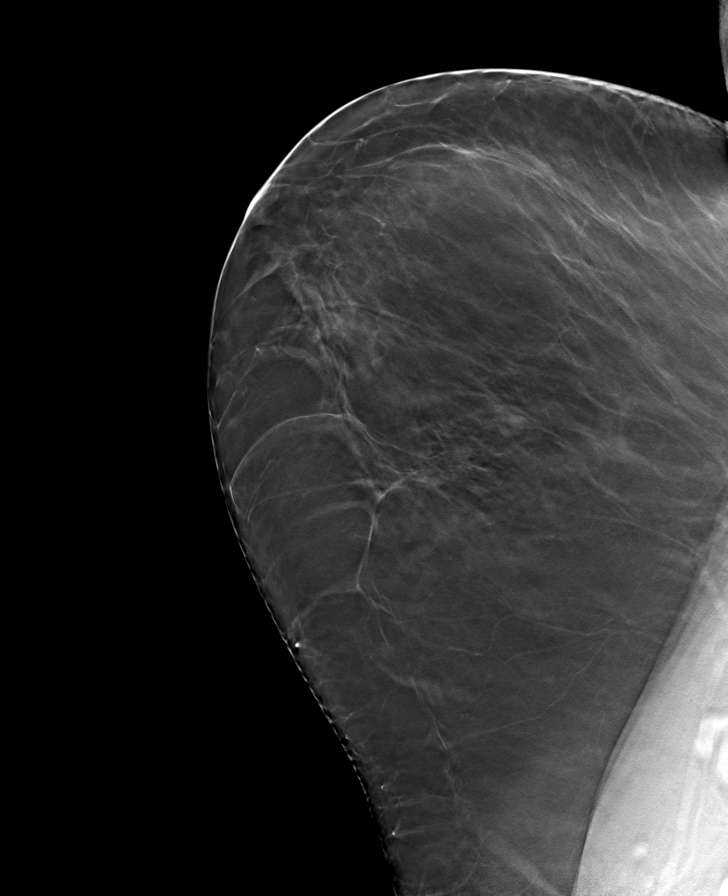

[R CC tomo · tomo slice 41/80.0]
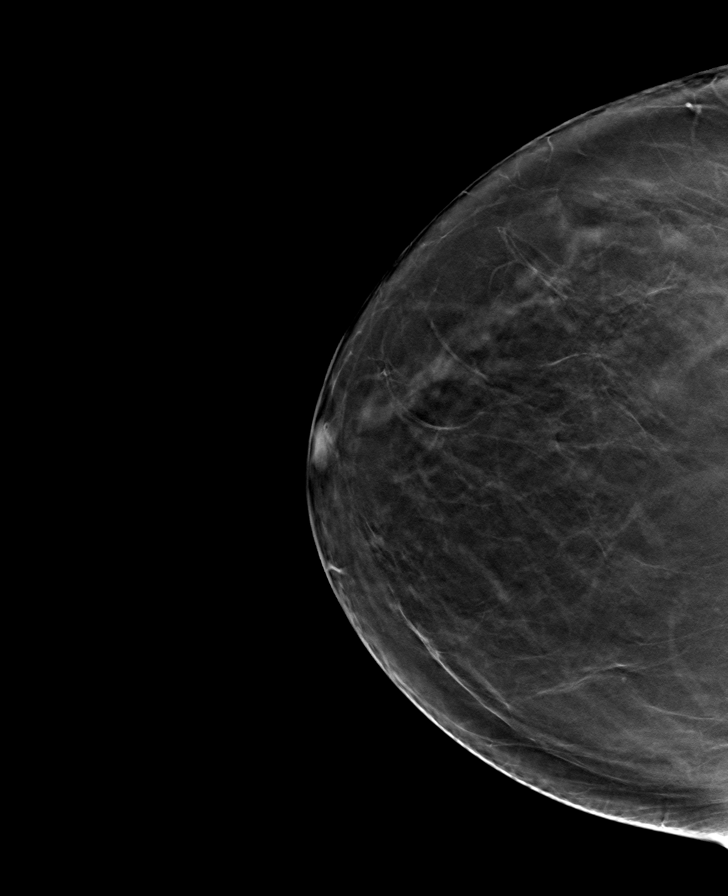

[L CC tomo · tomo slice 45/88.0]
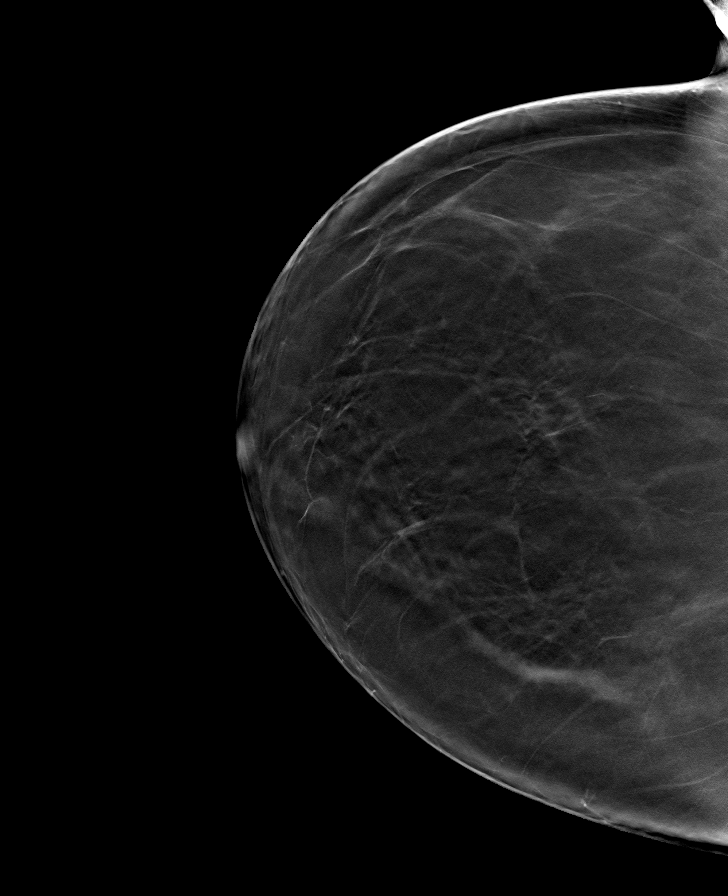

[8 of 24 positions shown; findings below may reference images not displayed]

ACR Breast Density Category b: There are scattered areas of
fibroglandular density.
FINDINGS: There are no findings suspicious for malignancy. Images were
processed with CAD.
IMPRESSION: No mammographic evidence of malignancy. A result letter of this
screening mammogram will be mailed directly to the patient.

RECOMMENDATION:
Screening mammogram in one year. (Code:[id number])

BI-RADS CATEGORY  1: Negative.

## 2021-10-26 ENCOUNTER — Encounter: Payer: Self-pay | Admitting: Dermatology

## 2021-11-02 ENCOUNTER — Emergency Department
Admission: EM | Admit: 2021-11-02 | Discharge: 2021-11-02 | Disposition: A | Discharge status: Home / Self Care | Payer: Medicare HMO | Attending: Student in an Organized Health Care Education/Training Program | Admitting: Student in an Organized Health Care Education/Training Program

## 2021-11-02 ENCOUNTER — Emergency Department: Payer: Medicare HMO

## 2021-11-02 ENCOUNTER — Encounter: Payer: Self-pay | Admitting: Emergency Medicine

## 2021-11-02 ENCOUNTER — Other Ambulatory Visit: Payer: Self-pay

## 2021-11-02 DIAGNOSIS — W19XXXA Unspecified fall, initial encounter: Secondary | ICD-10-CM

## 2021-11-02 DIAGNOSIS — S2020XA Contusion of thorax, unspecified, initial encounter: Secondary | ICD-10-CM | POA: Insufficient documentation

## 2021-11-02 DIAGNOSIS — W108XXA Fall (on) (from) other stairs and steps, initial encounter: Secondary | ICD-10-CM | POA: Diagnosis not present

## 2021-11-02 DIAGNOSIS — S20212A Contusion of left front wall of thorax, initial encounter: Secondary | ICD-10-CM

## 2021-11-02 DIAGNOSIS — S299XXA Unspecified injury of thorax, initial encounter: Secondary | ICD-10-CM | POA: Diagnosis present

## 2021-11-02 DIAGNOSIS — R0781 Pleurodynia: Secondary | ICD-10-CM | POA: Diagnosis not present

## 2021-11-02 MED ORDER — MELOXICAM 15 MG PO TABS: 15.0000 mg | ORAL_TABLET | Freq: Every day | ORAL | 0 refills | Status: AC

## 2021-11-02 MED ORDER — HYDROCODONE-ACETAMINOPHEN 5-325 MG PO TABS: 1.0000 | ORAL_TABLET | ORAL | 0 refills | Status: AC | PRN

## 2021-11-02 MED ORDER — MELOXICAM 7.5 MG PO TABS
15.0000 mg | ORAL_TABLET | Freq: Once | ORAL | Status: AC
  Administered 2021-11-02: 15 mg via ORAL
  Filled 2021-11-02: qty 2

## 2021-11-02 MED ORDER — HYDROCODONE-ACETAMINOPHEN 5-325 MG PO TABS
1.0000 | ORAL_TABLET | Freq: Once | ORAL | Status: AC
  Administered 2021-11-02: 1 via ORAL
  Filled 2021-11-02: qty 1

## 2021-11-02 NOTE — ED Provider Notes (Signed)
[facility] Provider Note  Patient Contact: 8:53 PM (approximate)   History   Fall   HPI  Lindsey Stevens is a 71 y.o. female who presents the emergency department with complaints of left rib pain.  Patient was going outside of her house when she missed a step, falling.  Patient states that she knew she was falling and was On concrete.  She tried to catch her self and in the process she landed on her left arm with her ribs.  Her arm sustained no injuries, she did not hit her head or lose consciousness.  Her only complaint is left rib pain.  No difficulty breathing or shortness of breath at this time.     Physical Exam   Triage Vital Signs: ED Triage Vitals  Enc Vitals Group     BP 11/02/21 2028 (!) 162/85     Pulse Rate 11/02/21 2028 85     Resp 11/02/21 2028 17     Temp 11/02/21 2028 99 F (37.2 C)     Temp Source 11/02/21 2028 Oral     SpO2 11/02/21 2028 94 %     Weight 11/02/21 2025 240 lb (108.9 kg)     Height 11/02/21 2025 5\' 3"  (1.6 m)     Head Circumference --      Peak Flow --      Pain Score 11/02/21 2025 4     Pain Loc --      Pain Edu? --      Excl. in [place]? --     Most recent vital signs: Vitals:   11/02/21 2028  BP: (!) 162/85  Pulse: 85  Resp: 17  Temp: 99 F (37.2 C)  SpO2: 94%     General: Alert and in no acute distress. Cardiovascular:  Good peripheral perfusion Respiratory: Normal respiratory effort without tachypnea or retractions. Lungs CTAB. Good air entry to the bases with no decreased or absent breath sounds. Musculoskeletal: Full range of motion to all extremities.  No visible signs of trauma to the left lateral ribs.  Tender diffusely along the left rib cage without palpable abnormality or crepitus.  Good underlying breath sounds bilaterally. Neurologic:  No gross focal neurologic deficits are appreciated.  Skin:   No rash noted Other:   ED Results / Procedures / Treatments   Labs (all labs ordered are  listed, but only abnormal results are displayed) Labs Reviewed - No data to display   EKG     RADIOLOGY  I personally viewed and evaluated these images as part of my medical decision making, as well as reviewing the written report by the radiologist.  ED Provider Interpretation: No acute traumatic finding on rib series with the left ribs  DG Ribs Unilateral W/Chest Left  Result Date: 11/02/2021 CLINICAL DATA:  [place], left rib pain EXAM: LEFT RIBS AND CHEST - 3+ VIEW COMPARISON:  07/13/2014 FINDINGS: Frontal view of the chest as well as frontal and oblique views of the left thoracic cage are obtained. Cardiac silhouette is unremarkable. No airspace disease, effusion, or pneumothorax. There are no acute displaced fractures. IMPRESSION: 1. No displaced fractures.  No acute intrathoracic process. Electronically Signed   By: Randa Ngo M.D.   On: 11/02/2021 20:49    PROCEDURES:  Critical Care performed: No  Procedures   MEDICATIONS ORDERED IN ED: Medications  meloxicam (MOBIC) tablet 15 mg (has no administration in time range)  HYDROcodone-acetaminophen (NORCO/VICODIN) 5-325 MG per tablet 1 tablet (has no  administration in time range)     IMPRESSION / MDM / ASSESSMENT AND PLAN / ED COURSE  I reviewed the triage vital signs and the nursing notes.                              Differential diagnosis includes, but is not limited to, rib fracture, rib contusion, pneumothorax   Patient's diagnosis is consistent with rib contusion.  Patient presents emergency department after falling and landing on her left ribs.  She not hit her head or lose consciousness.  She denied any other complaint other than rib pain at this time.  Imaging is reassuring with no evidence of fracture on x-ray.  Patient will be provided symptom control medication at home.  Concerning signs and symptoms are discussed with the patient and her husband who is present at this time.  Otherwise follow-up with primary  care as needed..  Patient is given ED precautions to return to the ED for any worsening or new symptoms.        FINAL CLINICAL IMPRESSION(S) / ED DIAGNOSES   Final diagnoses:  Fall, initial encounter  Contusion of rib on left side, initial encounter     Rx / DC Orders   ED Discharge Orders          Ordered    meloxicam (MOBIC) 15 MG tablet  Daily        11/02/21 2105    HYDROcodone-acetaminophen (NORCO/VICODIN) 5-325 MG tablet  Every 4 hours PRN        11/02/21 2105             Note:  This document was prepared using Dragon voice recognition software and may include unintentional dictation errors.   Brynda Peon 11/02/21 2105    Merlyn Lot, MD 11/02/21 2351

## 2021-11-02 NOTE — ED Notes (Signed)
Pt fell on the patio stairs & now has L rib area pain, worse with movement.

## 2021-11-02 NOTE — ED Triage Notes (Signed)
Pt to ED from home c/o fall tonight, states missed step at home and fell onto left side.  Pain to left ribs.  Pt A&Ox4, ambulatory with steady gait, skin WNL, chest rise even and unlabored, in NAD at this time.

## 2021-11-23 DIAGNOSIS — E782 Mixed hyperlipidemia: Secondary | ICD-10-CM | POA: Diagnosis not present

## 2021-11-23 DIAGNOSIS — Z79899 Other long term (current) drug therapy: Secondary | ICD-10-CM | POA: Diagnosis not present

## 2021-11-24 ENCOUNTER — Ambulatory Visit (INDEPENDENT_AMBULATORY_CARE_PROVIDER_SITE_OTHER): Payer: Self-pay | Admitting: Dermatology

## 2021-11-24 ENCOUNTER — Other Ambulatory Visit: Payer: Self-pay

## 2021-11-24 DIAGNOSIS — L988 Other specified disorders of the skin and subcutaneous tissue: Secondary | ICD-10-CM

## 2021-11-24 NOTE — Patient Instructions (Signed)

## 2021-11-24 NOTE — Progress Notes (Signed)
° °  Follow-Up Visit   Subjective  Lindsey Stevens is a 71 y.o. female who presents for the following: Rosacea (Actinic damage and rosacea (Patient is here today for her 3rd  treatment of the face with BBL laser. ). Erythema has improved. ). The following portions of the chart were reviewed this encounter and updated as appropriate:   Tobacco   Allergies   Meds   Problems   Med Hx   Surg Hx   Fam Hx      Review of Systems:  No other skin or systemic complaints except as noted in HPI or Assessment and Plan.  Objective  Well appearing patient in no apparent distress; mood and affect are within normal limits.  A focused examination was performed including face. Relevant physical exam findings are noted in the Assessment and Plan.  face Telangectasia and brown macules           Assessment & Plan  Elastosis of skin face  Photorejuvenation - face Prior to the procedure, the patient's past medical history, medications, allergies, and the rare but potential risks and complications were reviewed with the patient and a signed consent was obtained.  Pre and post treatment care was discussed and instructions provided.   [place] BBL - 11/24/21 1500      Patient Details   Skin Type: II    Anesthestic Cream Applied: No    Photo Takes: Yes    Consent Signed: Yes    Improvement from Previous Treatment: Yes      Treatment Details   Date: 11/24/21    Treatment #: 3    Area: face    Filter: 1st Pass      1st Pass   Location: F    BBL j/cm2: 23    PW Msec Sec: 27    Cooling Temp: 20    Pulses: 56      2nd Pass   Location: F    BBL j/cm2: 12    PW Msec Sec: 10    Cooling Temp: 25    Pulses: 141    Patient tolerated the procedure well.   Nancy Fetter avoidance was stressed. The patient will call with any problems, questions or concerns prior to their next appointment.  Return in about 2 months (around 01/22/2022) for BBL .  IMarye Round, CMA, am acting as scribe for Sarina Ser, MD  .  Documentation: I have reviewed the above documentation for accuracy and completeness, and I agree with the above.  Sarina Ser, MD

## 2021-11-25 ENCOUNTER — Telehealth: Payer: Self-pay

## 2021-11-25 NOTE — Telephone Encounter (Signed)
Called pt to see how she was doing post laser treatment, pt report she is doing well, no swelling,

## 2021-11-26 ENCOUNTER — Encounter: Payer: Self-pay | Admitting: Dermatology

## 2021-12-01 DIAGNOSIS — I471 Supraventricular tachycardia: Secondary | ICD-10-CM | POA: Diagnosis not present

## 2021-12-01 DIAGNOSIS — Z0000 Encounter for general adult medical examination without abnormal findings: Secondary | ICD-10-CM | POA: Diagnosis not present

## 2021-12-01 DIAGNOSIS — E785 Hyperlipidemia, unspecified: Secondary | ICD-10-CM | POA: Diagnosis not present

## 2021-12-01 DIAGNOSIS — R002 Palpitations: Secondary | ICD-10-CM | POA: Diagnosis not present

## 2021-12-01 DIAGNOSIS — I10 Essential (primary) hypertension: Secondary | ICD-10-CM | POA: Diagnosis not present

## 2021-12-01 DIAGNOSIS — Z79899 Other long term (current) drug therapy: Secondary | ICD-10-CM | POA: Diagnosis not present

## 2021-12-01 DIAGNOSIS — Z6841 Body Mass Index (BMI) 40.0 and over, adult: Secondary | ICD-10-CM | POA: Diagnosis not present

## 2021-12-16 DIAGNOSIS — H524 Presbyopia: Secondary | ICD-10-CM | POA: Diagnosis not present

## 2021-12-16 DIAGNOSIS — Z961 Presence of intraocular lens: Secondary | ICD-10-CM | POA: Diagnosis not present

## 2022-01-09 DIAGNOSIS — J019 Acute sinusitis, unspecified: Secondary | ICD-10-CM | POA: Diagnosis not present

## 2022-01-09 DIAGNOSIS — R051 Acute cough: Secondary | ICD-10-CM | POA: Diagnosis not present

## 2022-01-12 ENCOUNTER — Other Ambulatory Visit: Payer: Self-pay | Admitting: Dermatology

## 2022-01-20 DIAGNOSIS — H26492 Other secondary cataract, left eye: Secondary | ICD-10-CM | POA: Diagnosis not present

## 2022-01-25 ENCOUNTER — Ambulatory Visit: Payer: Self-pay | Admitting: Dermatology

## 2022-01-25 DIAGNOSIS — J019 Acute sinusitis, unspecified: Secondary | ICD-10-CM | POA: Diagnosis not present

## 2022-02-10 DIAGNOSIS — Z0100 Encounter for examination of eyes and vision without abnormal findings: Secondary | ICD-10-CM | POA: Diagnosis not present

## 2022-03-01 DIAGNOSIS — E782 Mixed hyperlipidemia: Secondary | ICD-10-CM | POA: Diagnosis not present

## 2022-03-01 DIAGNOSIS — I10 Essential (primary) hypertension: Secondary | ICD-10-CM | POA: Diagnosis not present

## 2022-03-01 DIAGNOSIS — Z79899 Other long term (current) drug therapy: Secondary | ICD-10-CM | POA: Diagnosis not present

## 2022-03-07 DIAGNOSIS — Z6841 Body Mass Index (BMI) 40.0 and over, adult: Secondary | ICD-10-CM | POA: Diagnosis not present

## 2022-03-07 DIAGNOSIS — I10 Essential (primary) hypertension: Secondary | ICD-10-CM | POA: Diagnosis not present

## 2022-03-07 DIAGNOSIS — E782 Mixed hyperlipidemia: Secondary | ICD-10-CM | POA: Diagnosis not present

## 2022-03-07 DIAGNOSIS — L719 Rosacea, unspecified: Secondary | ICD-10-CM | POA: Diagnosis not present

## 2022-03-07 DIAGNOSIS — Z79899 Other long term (current) drug therapy: Secondary | ICD-10-CM | POA: Diagnosis not present

## 2022-03-07 DIAGNOSIS — Z0000 Encounter for general adult medical examination without abnormal findings: Secondary | ICD-10-CM | POA: Diagnosis not present

## 2022-03-15 DIAGNOSIS — M8588 Other specified disorders of bone density and structure, other site: Secondary | ICD-10-CM | POA: Diagnosis not present

## 2022-04-21 DIAGNOSIS — Z0100 Encounter for examination of eyes and vision without abnormal findings: Secondary | ICD-10-CM | POA: Diagnosis not present

## 2022-06-20 DIAGNOSIS — J40 Bronchitis, not specified as acute or chronic: Secondary | ICD-10-CM | POA: Diagnosis not present

## 2022-08-29 DIAGNOSIS — Z79899 Other long term (current) drug therapy: Secondary | ICD-10-CM | POA: Diagnosis not present

## 2022-08-29 DIAGNOSIS — E782 Mixed hyperlipidemia: Secondary | ICD-10-CM | POA: Diagnosis not present

## 2022-09-05 ENCOUNTER — Other Ambulatory Visit: Payer: Self-pay | Admitting: Internal Medicine

## 2022-09-05 DIAGNOSIS — Z1231 Encounter for screening mammogram for malignant neoplasm of breast: Secondary | ICD-10-CM

## 2022-09-05 DIAGNOSIS — E782 Mixed hyperlipidemia: Secondary | ICD-10-CM | POA: Diagnosis not present

## 2022-09-05 DIAGNOSIS — Z6841 Body Mass Index (BMI) 40.0 and over, adult: Secondary | ICD-10-CM | POA: Diagnosis not present

## 2022-09-05 DIAGNOSIS — Z79899 Other long term (current) drug therapy: Secondary | ICD-10-CM | POA: Diagnosis not present

## 2022-09-05 DIAGNOSIS — I10 Essential (primary) hypertension: Secondary | ICD-10-CM | POA: Diagnosis not present

## 2022-09-05 DIAGNOSIS — R002 Palpitations: Secondary | ICD-10-CM | POA: Diagnosis not present

## 2022-09-19 ENCOUNTER — Other Ambulatory Visit: Payer: Self-pay

## 2022-09-19 ENCOUNTER — Other Ambulatory Visit: Payer: Self-pay | Admitting: Dermatology

## 2022-09-19 MED ORDER — ADAPALENE 0.3 % EX GEL: 1.0000 | Freq: Every day | CUTANEOUS | 0 refills | Status: DC

## 2022-09-19 NOTE — Progress Notes (Signed)
Patient called asking for Rfs os Rosacea triple cream from [facility] and Adapalene Gel. 1 RF sent in and follow up scheduled with Dr. Nehemiah Massed. aw

## 2022-09-30 ENCOUNTER — Ambulatory Visit
Admission: RE | Admit: 2022-09-30 | Discharge: 2022-09-30 | Disposition: A | Discharge status: Home / Self Care | Payer: Medicare HMO | Source: Ambulatory Visit | Attending: Internal Medicine | Admitting: Internal Medicine

## 2022-09-30 DIAGNOSIS — Z1231 Encounter for screening mammogram for malignant neoplasm of breast: Secondary | ICD-10-CM | POA: Diagnosis not present

## 2022-11-23 ENCOUNTER — Encounter: Payer: Self-pay | Admitting: Dermatology

## 2022-11-23 ENCOUNTER — Ambulatory Visit (INDEPENDENT_AMBULATORY_CARE_PROVIDER_SITE_OTHER): Payer: Medicare HMO | Admitting: Dermatology

## 2022-11-23 VITALS — BP 145/77 | HR 68

## 2022-11-23 DIAGNOSIS — Z79899 Other long term (current) drug therapy: Secondary | ICD-10-CM

## 2022-11-23 DIAGNOSIS — Z7189 Other specified counseling: Secondary | ICD-10-CM

## 2022-11-23 DIAGNOSIS — L719 Rosacea, unspecified: Secondary | ICD-10-CM | POA: Diagnosis not present

## 2022-11-23 DIAGNOSIS — L814 Other melanin hyperpigmentation: Secondary | ICD-10-CM | POA: Diagnosis not present

## 2022-11-23 MED ORDER — ADAPALENE 0.3 % EX GEL: 1.0000 | Freq: Every day | CUTANEOUS | 11 refills | Status: DC

## 2022-11-23 MED ORDER — IVERMECTIN 1 % EX CREA: 1.0000 | TOPICAL_CREAM | Freq: Every day | CUTANEOUS | 11 refills | Status: DC

## 2022-11-23 NOTE — Patient Instructions (Addendum)
Instructions for [facility] Medications  One or more of your medications was sent to the [facility] mail order compounding pharmacy. You will receive an email from them and can purchase the medicine through that link. It will then be mailed to your home at the address you confirmed. If for any reason you do not receive an email from them, please check your spam folder. If you still do not find the email, please let us know. [facility] phone number is [phone].       Due to recent changes in healthcare laws, you may see results of your pathology and/or laboratory studies on MyChart before the doctors have had a chance to review them. We understand that in some cases there may be results that are confusing or concerning to you. Please understand that not all results are received at the same time and often the doctors may need to interpret multiple results in order to provide you with the best plan of care or course of treatment. Therefore, we ask that you please give us 2 business days to thoroughly review all your results before contacting the office for clarification. Should we see a critical lab result, you will be contacted sooner.   If You Need Anything After Your Visit  If you have any questions or concerns for your doctor, please call our main line at [phone] and press option 4 to reach your doctor's medical assistant. If no one answers, please leave a voicemail as directed and we will return your call as soon as possible. Messages left after 4 pm will be answered the following business day.   You may also send us a message via MyChart. We typically respond to MyChart messages within 1-2 business days.  For prescription refills, please ask your pharmacy to contact our office. Our fax number is [phone].  If you have an urgent issue when the clinic is closed that cannot wait until the next business day, you can page your doctor at the number below.    Please note  that while we do our best to be available for urgent issues outside of office hours, we are not available 24/7.   If you have an urgent issue and are unable to reach us, you may choose to seek medical care at your doctor's office, retail clinic, urgent care center, or emergency room.  If you have a medical emergency, please immediately call 911 or go to the emergency department.  Pager Numbers  - Dr. Kowalski: [phone]  - Dr. Moye: [phone]  - Dr. Stewart: [phone]  In the event of inclement weather, please call our main line at [phone] for an update on the status of any delays or closures.  Dermatology Medication Tips: Please keep the boxes that topical medications come in in order to help keep track of the instructions about where and how to use these. Pharmacies typically print the medication instructions only on the boxes and not directly on the medication tubes.   If your medication is too expensive, please contact our office at [phone] option 4 or send us a message through MyChart.   We are unable to tell what your co-pay for medications will be in advance as this is different depending on your insurance coverage. However, we may be able to find a substitute medication at lower cost or fill out paperwork to get insurance to cover a needed medication.   If a prior authorization is required to get your medication covered by your insurance   company, please allow us 1-2 business days to complete this process.  Drug prices often vary depending on where the prescription is filled and some pharmacies may offer cheaper prices.  The website www.goodrx.com contains coupons for medications through different pharmacies. The prices here do not account for what the cost may be with help from insurance (it may be cheaper with your insurance), but the website can give you the price if you did not use any insurance.  - You can print the associated coupon and take it with your  prescription to the pharmacy.  - You may also stop by our office during regular business hours and pick up a GoodRx coupon card.  - If you need your prescription sent electronically to a different pharmacy, notify our office through [place] MyChart or by phone at [phone] option 4.     Si Usted Necesita Algo Despus de Su Visita  Tambin puede enviarnos un mensaje a travs de MyChart. Por lo general respondemos a los mensajes de MyChart en el transcurso de 1 a 2 das hbiles.  Para renovar recetas, por favor pida a su farmacia que se ponga en contacto con nuestra oficina. Nuestro nmero de fax es el [phone].  Si tiene un asunto urgente cuando la clnica est cerrada y que no puede esperar hasta el siguiente da hbil, puede llamar/localizar a su doctor(a) al nmero que aparece a continuacin.   Por favor, tenga en cuenta que aunque hacemos todo lo posible para estar disponibles para asuntos urgentes fuera del horario de oficina, no estamos disponibles las 24 horas del da, los 7 das de la semana.   Si tiene un problema urgente y no puede comunicarse con nosotros, puede optar por buscar atencin mdica  en el consultorio de su doctor(a), en una clnica privada, en un centro de atencin urgente o en una sala de emergencias.  Si tiene una emergencia mdica, por favor llame inmediatamente al 911 o vaya a la sala de emergencias.  Nmeros de bper  - Dr. Kowalski: [phone]  - Dra. Moye: [phone]  - Dra. Stewart: [phone]  En caso de inclemencias del tiempo, por favor llame a nuestra lnea principal al [phone] para una actualizacin sobre el estado de cualquier retraso o cierre.  Consejos para la medicacin en dermatologa: Por favor, guarde las cajas en las que vienen los medicamentos de uso tpico para ayudarle a seguir las instrucciones sobre dnde y cmo usarlos. Las farmacias generalmente imprimen las instrucciones del medicamento slo en las cajas y no  directamente en los tubos del medicamento.   Si su medicamento es muy caro, por favor, pngase en contacto con nuestra oficina llamando al [phone] y presione la opcin 4 o envenos un mensaje a travs de MyChart.   No podemos decirle cul ser su copago por los medicamentos por adelantado ya que esto es diferente dependiendo de la cobertura de su seguro. Sin embargo, es posible que podamos encontrar un medicamento sustituto a menor costo o llenar un formulario para que el seguro cubra el medicamento que se considera necesario.   Si se requiere una autorizacin previa para que su compaa de seguros cubra su medicamento, por favor permtanos de 1 a 2 das hbiles para completar este proceso.  Los precios de los medicamentos varan con frecuencia dependiendo del lugar de dnde se surte la receta y alguna farmacias pueden ofrecer precios ms baratos.  El sitio web www.goodrx.com tiene cupones para medicamentos de diferentes farmacias. Los precios aqu no tienen en cuenta   lo que podra costar con la ayuda del seguro (puede ser ms barato con su seguro), pero el sitio web puede darle el precio si no utiliz ningn seguro.  - Puede imprimir el cupn correspondiente y llevarlo con su receta a la farmacia.  - Tambin puede pasar por nuestra oficina durante el horario de atencin regular y recoger una tarjeta de cupones de GoodRx.  - Si necesita que su receta se enve electrnicamente a una farmacia diferente, informe a nuestra oficina a travs de MyChart de [place] o por telfono llamando al [phone] y presione la opcin 4.  

## 2022-11-23 NOTE — Progress Notes (Signed)
Follow-Up Visit   Subjective  Lindsey Stevens is a 72 y.o. female who presents for the following: Rosacea (Face, SM Triple cream qam). The patient has spots, moles and lesions to be evaluated, some may be new or changing and the patient has concerns that these could be cancer.  The following portions of the chart were reviewed this encounter and updated as appropriate:   Tobacco  Allergies  Meds  Problems  Med Hx  Surg Hx  Fam Hx     Review of Systems:  No other skin or systemic complaints except as noted in HPI or Assessment and Plan.  Objective  Well appearing patient in no apparent distress; mood and affect are within normal limits.  A focused examination was performed including face. Relevant physical exam findings are noted in the Assessment and Plan.  Head - Anterior (Face) Face clear today, mild erythema  face Brown macules face   Assessment & Plan   Seborrheic Keratoses - Stuck-on, waxy, tan-brown papules and/or plaques  - Benign-appearing - Discussed benign etiology and prognosis. - Observe - Call for any changes - face  Rosacea Head - Anterior (Face)  Rosacea is a chronic progressive skin condition usually affecting the face of adults, causing redness and/or acne bumps. It is treatable but not curable. It sometimes affects the eyes (ocular rosacea) as well. It may respond to topical and/or systemic medication and can flare with stress, sun exposure, alcohol, exercise, topical steroids (including hydrocortisone/cortisone 10) and some foods.  Daily application of broad spectrum spf 30+ sunscreen to face is recommended to reduce flares.  Cont SM Triple cream qam Cont Adapalene 0.3% gel qhs  Topical retinoid medications like tretinoin/Retin-A, adapalene/Differin, tazarotene/Fabior, and Epiduo/Epiduo Forte can cause dryness and irritation when first started. Only apply a pea-sized amount to the entire affected area. Avoid applying it around the eyes, edges of  mouth and creases at the nose. If you experience irritation, use a good moisturizer first and/or apply the medicine less often. If you are doing well with the medicine, you can increase how often you use it until you are applying every night. Be careful with sun protection while using this medication as it can make you sensitive to the sun. This medicine should not be used by pregnant women.    Ivermectin 1 % CREA - Head - Anterior (Face) Apply 1 Application topically at bedtime.  Adapalene (DIFFERIN) 0.3 % gel - Head - Anterior (Face) Apply 1 Application topically at bedtime. Qhs to face  Lentigines face  Counseling for BBL / IPL / Laser and Coordination of Care Discussed the treatment option of Broad Band Light (BBL) /Intense Pulsed Light (IPL)/ Laser for skin discoloration, including brown spots and redness.  Typically we recommend at least 1-3 treatment sessions about 5-8 weeks apart for best results.  Cannot have tanned skin when BBL performed, and regular use of sunscreen is advised after the procedure to help maintain results. The patient's condition may also require "maintenance treatments" in the future.  The fee for BBL / laser treatments is $350 per treatment session for the whole face.  A fee can be quoted for other parts of the body.  Insurance typically does not pay for BBL/laser treatments and therefore the fee is an out-of-pocket cost.    Return in about 1 year (around 11/24/2023) for Rosacea f/u.  I, Othelia Pulling, RMA, am acting as scribe for Sarina Ser, MD . Documentation: I have reviewed the above documentation for accuracy and  completeness, and I agree with the above.  Sarina Ser, MD

## 2023-02-27 ENCOUNTER — Encounter: Payer: Self-pay | Admitting: Orthopaedic Surgery

## 2023-02-27 ENCOUNTER — Other Ambulatory Visit: Payer: Self-pay | Admitting: Orthopaedic Surgery

## 2023-02-27 DIAGNOSIS — S42201A Unspecified fracture of upper end of right humerus, initial encounter for closed fracture: Secondary | ICD-10-CM

## 2023-02-28 ENCOUNTER — Ambulatory Visit
Admission: RE | Admit: 2023-02-28 | Discharge: 2023-02-28 | Disposition: A | Discharge status: Home / Self Care | Payer: Medicare HMO | Source: Ambulatory Visit | Attending: Orthopaedic Surgery | Admitting: Orthopaedic Surgery

## 2023-02-28 DIAGNOSIS — S42201A Unspecified fracture of upper end of right humerus, initial encounter for closed fracture: Secondary | ICD-10-CM | POA: Diagnosis present

## 2023-05-04 ENCOUNTER — Other Ambulatory Visit: Payer: Self-pay | Admitting: Internal Medicine

## 2023-05-04 DIAGNOSIS — Z1231 Encounter for screening mammogram for malignant neoplasm of breast: Secondary | ICD-10-CM

## 2023-10-20 ENCOUNTER — Ambulatory Visit
Admission: RE | Admit: 2023-10-20 | Discharge: 2023-10-20 | Disposition: A | Discharge status: Home / Self Care | Payer: Medicare Other | Source: Ambulatory Visit | Attending: Internal Medicine | Admitting: Internal Medicine

## 2023-10-20 DIAGNOSIS — Z1231 Encounter for screening mammogram for malignant neoplasm of breast: Secondary | ICD-10-CM | POA: Insufficient documentation

## 2023-11-29 ENCOUNTER — Ambulatory Visit: Payer: Medicare HMO | Admitting: Dermatology

## 2024-01-03 ENCOUNTER — Ambulatory Visit (INDEPENDENT_AMBULATORY_CARE_PROVIDER_SITE_OTHER): Payer: Medicare HMO | Admitting: Dermatology

## 2024-01-03 ENCOUNTER — Encounter: Payer: Self-pay | Admitting: Dermatology

## 2024-01-03 DIAGNOSIS — Z79899 Other long term (current) drug therapy: Secondary | ICD-10-CM

## 2024-01-03 DIAGNOSIS — Z7189 Other specified counseling: Secondary | ICD-10-CM

## 2024-01-03 DIAGNOSIS — W908XXA Exposure to other nonionizing radiation, initial encounter: Secondary | ICD-10-CM | POA: Diagnosis not present

## 2024-01-03 DIAGNOSIS — L719 Rosacea, unspecified: Secondary | ICD-10-CM

## 2024-01-03 DIAGNOSIS — L814 Other melanin hyperpigmentation: Secondary | ICD-10-CM

## 2024-01-03 DIAGNOSIS — L821 Other seborrheic keratosis: Secondary | ICD-10-CM | POA: Diagnosis not present

## 2024-01-03 DIAGNOSIS — L578 Other skin changes due to chronic exposure to nonionizing radiation: Secondary | ICD-10-CM | POA: Diagnosis not present

## 2024-01-03 MED ORDER — ADAPALENE 0.3 % EX GEL: 1.0000 | Freq: Every day | CUTANEOUS | 11 refills | Status: AC

## 2024-01-03 MED ORDER — IVERMECTIN 1 % EX CREA: 1.0000 | TOPICAL_CREAM | Freq: Every day | CUTANEOUS | 11 refills | Status: AC

## 2024-01-03 NOTE — Patient Instructions (Addendum)
 Cryotherapy Aftercare  Wash gently with soap and water everyday.   Apply Vaseline Jelly daily until healed.     Cont SM Rosacea Triple cream every morning Cont Adapalene 0.3% gel at bedtime   Topical retinoid medications like tretinoin/Retin-A, adapalene/Differin, tazarotene/Fabior, and Epiduo/Epiduo Forte can cause dryness and irritation when first started. Only apply a pea-sized amount to the entire affected area. Avoid applying it around the eyes, edges of mouth and creases at the nose. If you experience irritation, use a good moisturizer first and/or apply the medicine less often. If you are doing well with the medicine, you can increase how often you use it until you are applying every night. Be careful with sun protection while using this medication as it can make you sensitive to the sun. This medicine should not be used by pregnant women.       Recommend daily broad spectrum sunscreen SPF 30+ to sun-exposed areas, reapply every 2 hours as needed. Call for new or changing lesions.  Staying in the shade or wearing long sleeves, sun glasses (UVA+UVB protection) and wide brim hats (4-inch brim around the entire circumference of the hat) are also recommended for sun protection.       Due to recent changes in healthcare laws, you may see results of your pathology and/or laboratory studies on MyChart before the doctors have had a chance to review them. We understand that in some cases there may be results that are confusing or concerning to you. Please understand that not all results are received at the same time and often the doctors may need to interpret multiple results in order to provide you with the best plan of care or course of treatment. Therefore, we ask that you please give [place] 2 business days to thoroughly review all your results before contacting the office for clarification. Should we see a critical lab result, you will be contacted sooner.   If You Need Anything After Your  Visit  If you have any questions or concerns for your doctor, please call our main line at [phone] and press option 4 to reach your doctor's medical assistant. If no one answers, please leave a voicemail as directed and we will return your call as soon as possible. Messages left after 4 pm will be answered the following business day.   You may also send [place] a message via MyChart. We typically respond to MyChart messages within 1-2 business days.  For prescription refills, please ask your pharmacy to contact our office. Our fax number is [phone].  If you have an urgent issue when the clinic is closed that cannot wait until the next business day, you can page your doctor at the number below.    Please note that while we do our best to be available for urgent issues outside of office hours, we are not available 24/7.   If you have an urgent issue and are unable to reach [place], you may choose to seek medical care at your doctor's office, retail clinic, urgent care center, or emergency room.  If you have a medical emergency, please immediately call 911 or go to the emergency department.  Pager Numbers  - Dr. Gwen Pounds: [phone]  - Dr. Roseanne Reno: [phone]  - Dr. Katrinka Blazing: [phone]   In the event of inclement weather, please call our main line at [phone] for an update on the status of any delays or closures.  Dermatology Medication Tips: Please keep the boxes that topical medications come in in order to  help keep track of the instructions about where and how to use these. Pharmacies typically print the medication instructions only on the boxes and not directly on the medication tubes.   If your medication is too expensive, please contact our office at [phone] option 4 or send [place] a message through MyChart.   We are unable to tell what your co-pay for medications will be in advance as this is different depending on your insurance coverage. However, we may be able to find a  substitute medication at lower cost or fill out paperwork to get insurance to cover a needed medication.   If a prior authorization is required to get your medication covered by your insurance company, please allow [place] 1-2 business days to complete this process.  Drug prices often vary depending on where the prescription is filled and some pharmacies may offer cheaper prices.  The website www.goodrx.com contains coupons for medications through different pharmacies. The prices here do not account for what the cost may be with help from insurance (it may be cheaper with your insurance), but the website can give you the price if you did not use any insurance.  - You can print the associated coupon and take it with your prescription to the pharmacy.  - You may also stop by our office during regular business hours and pick up a GoodRx coupon card.  - If you need your prescription sent electronically to a different pharmacy, notify our office through [facility] or by phone at [phone] option 4.     Si Usted Necesita Algo Despus de Su Visita  Tambin puede enviarnos un mensaje a travs de Clinical cytogeneticist. Por lo general respondemos a los mensajes de MyChart en el transcurso de 1 a 2 das hbiles.  Para renovar recetas, por favor pida a su farmacia que se ponga en contacto con nuestra oficina. Annie Sable de fax es [place] [phone].  Si tiene un asunto urgente cuando la clnica est cerrada y que no puede esperar hasta el siguiente da hbil, puede llamar/localizar a su doctor(a) al nmero que aparece a continuacin.   Por favor, tenga en cuenta que aunque hacemos todo lo posible para estar disponibles para asuntos urgentes fuera del horario de [place], no estamos disponibles las 24 horas del da, los 7 [address]  [address] de la [place].   Si tiene un problema urgente y no puede comunicarse con nosotros, puede optar por buscar atencin mdica  en el consultorio de su doctor(a), en una clnica privada, en un  centro de atencin urgente o en una sala de emergencias.  Si tiene Engineer, drilling, por favor llame inmediatamente al 911 o vaya a la sala de emergencias.  Nmeros de bper  - Dr. Gwen Pounds: [phone]  - Dra. Roseanne Reno: [phone]  - Dr. Katrinka Blazing: [phone]   En caso de inclemencias del tiempo, por favor llame a Lacy Duverney principal al [phone] para una actualizacin sobre el [place] de cualquier retraso o cierre.  Consejos para la medicacin en dermatologa: Por favor, guarde las cajas en las que vienen los medicamentos de uso tpico para ayudarle a seguir las instrucciones sobre dnde y cmo usarlos. Las farmacias generalmente imprimen las instrucciones del medicamento slo en las cajas y no directamente en los tubos del [place].   Si su medicamento es muy caro, por favor, pngase en contacto con Rolm Gala llamando al [phone] y presione la opcin 4 o envenos un mensaje a travs de Clinical cytogeneticist.   No podemos decirle cul ser su copago  por los medicamentos por adelantado ya que esto es diferente dependiendo de la cobertura de su seguro. Sin embargo, es posible que podamos encontrar un medicamento sustituto a Audiological scientist un formulario para que el seguro cubra el medicamento que se considera necesario.   Si se requiere una autorizacin previa para que su compaa de seguros [place] su medicamento, por favor permtanos de 1 a 2 das hbiles para completar [address]  Los precios de los medicamentos varan con frecuencia dependiendo del Environmental consultant de dnde se surte la receta y alguna farmacias pueden ofrecer precios ms baratos.  El sitio web www.goodrx.com tiene cupones para medicamentos de Health and safety inspector. Los precios aqu no tienen en cuenta lo que podra costar con la ayuda del seguro (puede ser ms barato con su seguro), pero el sitio web puede darle el precio si no utiliz Tourist information centre manager.  - Puede imprimir el cupn correspondiente y llevarlo con su  receta a la farmacia.  - Tambin puede pasar por nuestra oficina durante el horario de atencin regular y Education officer, museum una tarjeta de cupones de GoodRx.  - Si necesita que su receta se enve electrnicamente a una farmacia diferente, informe a nuestra oficina a travs de MyChart de [place] o por telfono llamando al [phone] y presione la opcin 4.

## 2024-01-03 NOTE — Progress Notes (Signed)
 Follow-Up Visit   Subjective  Lindsey Stevens is a 73 y.o. female who presents for the following: Rosacea. Yearly follow up. Using SK Triple cream every morning and night and Adapalene every night. Reports rosacea is doing well with this treatment regimen. Needs refills.   The following portions of the chart were reviewed this encounter and updated as appropriate: medications, allergies, medical history  Review of Systems:  No other skin or systemic complaints except as noted in HPI or Assessment and Plan.  Objective  Well appearing patient in no apparent distress; mood and affect are within normal limits. Areas Examined: face Relevant physical exam findings are noted in the Assessment and Plan.  face x4 (4) Stuck-on, waxy, tan-brown papule or plaque --Discussed benign etiology and prognosis.        Assessment & Plan   ROSACEA   Related Medications Ivermectin 1 % CREA Apply 1 Application topically at bedtime. Adapalene (DIFFERIN) 0.3 % gel Apply 1 Application topically at bedtime. Qhs to face SEBORRHEIC KERATOSIS (4) face x4 (4) Discussed cosmetic procedure LN2 for ISK, noncovered.  $60 for 1st lesion and $15 for each additional lesion if done on the same day.  Maximum charge $350.  One touch-up treatment included no charge. Discussed risks of treatment including dyspigmentation, small scar, and/or recurrence. Recommend daily broad spectrum sunscreen SPF 30+/photoprotection to treated areas once healed. Destruction of lesion - face x4 (4) Complexity: simple   Destruction method: cryotherapy   Informed consent: discussed and consent obtained   Timeout:  patient name, date of birth, surgical site, and procedure verified Lesion destroyed using liquid nitrogen: Yes   Region frozen until ice ball extended beyond lesion: Yes   Outcome: patient tolerated procedure well with no complications   Post-procedure details: wound care instructions given   Additional details:  Prior to  procedure, discussed risks of blister formation, small wound, skin dyspigmentation, or rare scar following cryotherapy. Recommend Vaseline ointment to treated areas while healing.   ROSACEA Exam:  Mid face erythema with telangiectasias - today without papules. Chronic condition with duration or expected duration over one year. Currently well-controlled.  Rosacea is a chronic progressive skin condition usually affecting the face of adults, causing redness and/or acne bumps. It is treatable but not curable. It sometimes affects the eyes (ocular rosacea) as well. It may respond to topical and/or systemic medication and can flare with stress, sun exposure, alcohol, exercise, topical steroids (including hydrocortisone/cortisone 10) and some foods.  Daily application of broad spectrum spf 30+ sunscreen to face is recommended to reduce flares. Treatment Plan Cont SM Rosacea Triple cream every morning Cont Adapalene 0.3% gel at bedtime   Topical retinoid medications like tretinoin/Retin-A, adapalene/Differin, tazarotene/Fabior, and Epiduo/Epiduo Forte can cause dryness and irritation when first started. Only apply a pea-sized amount to the entire affected area. Avoid applying it around the eyes, edges of mouth and creases at the nose. If you experience irritation, use a good moisturizer first and/or apply the medicine less often. If you are doing well with the medicine, you can increase how often you use it until you are applying every night. Be careful with sun protection while using this medication as it can make you sensitive to the sun. This medicine should not be used by pregnant women.   Long term medication management.  Patient is using long term (months to years) prescription medication  to control their dermatologic condition.  These medications require periodic monitoring to evaluate for efficacy and side effects.  LENTIGINES and Actinic Changes Exam: scattered tan macules Due to sun  exposure Treatment Plan: Benign-appearing, observe. Recommend daily broad spectrum sunscreen SPF 30+ to sun-exposed areas, reapply every 2 hours as needed.  Call for any changes Counseling for BBL / IPL / Laser and Coordination of Care Discussed the treatment option of Broad Band Light (BBL) /Intense Pulsed Light (IPL)/ Laser for skin discoloration, including brown spots and redness.  Typically we recommend at least 1-3 treatment sessions about 5-8 weeks apart for best results.  Cannot have tanned skin when BBL performed, and regular use of sunscreen/photoprotection is advised after the procedure to help maintain results. The patient's condition may also require "maintenance treatments" in the future.  The fee for BBL / laser treatments is $350 per treatment session for the whole face.  A fee can be quoted for other parts of the body.  Insurance typically does not pay for BBL/laser treatments and therefore the fee is an out-of-pocket cost. Recommend prophylactic valtrex treatment. Once scheduled for procedure, will send Rx in prior to patient's appointment.   ACTINIC DAMAGE - chronic, secondary to cumulative UV radiation exposure/sun exposure over time - diffuse scaly erythematous macules with underlying dyspigmentation - Recommend daily broad spectrum sunscreen SPF 30+ to sun-exposed areas, reapply every 2 hours as needed.  - Recommend staying in the shade or wearing long sleeves, sun glasses (UVA+UVB protection) and wide brim hats (4-inch brim around the entire circumference of the hat). - Call for new or changing lesions.  Return in about 1 year (around 01/02/2025) for Rosacea Follow Up.  I, Lawson Radar, CMA, am acting as scribe for Armida Sans, MD.   Documentation: I have reviewed the above documentation for accuracy and completeness, and I agree with the above.  Armida Sans, MD

## 2024-06-27 ENCOUNTER — Other Ambulatory Visit: Payer: Self-pay | Admitting: Internal Medicine

## 2024-06-27 DIAGNOSIS — Z1231 Encounter for screening mammogram for malignant neoplasm of breast: Secondary | ICD-10-CM

## 2024-10-21 ENCOUNTER — Ambulatory Visit
Admission: RE | Admit: 2024-10-21 | Discharge: 2024-10-21 | Disposition: A | Discharge status: Home / Self Care | Source: Ambulatory Visit | Attending: Internal Medicine | Admitting: Internal Medicine

## 2024-10-21 DIAGNOSIS — Z1231 Encounter for screening mammogram for malignant neoplasm of breast: Secondary | ICD-10-CM | POA: Insufficient documentation

## 2024-11-14 ENCOUNTER — Encounter: Payer: Self-pay | Admitting: Gastroenterology

## 2024-11-15 ENCOUNTER — Other Ambulatory Visit: Payer: Self-pay

## 2024-11-15 ENCOUNTER — Ambulatory Visit: Admitting: Anesthesiology

## 2024-11-15 ENCOUNTER — Encounter
Admission: RE | Disposition: A | Discharge status: Home / Self Care | Payer: Self-pay | Source: Home / Self Care | Attending: Gastroenterology

## 2024-11-15 ENCOUNTER — Ambulatory Visit
Admission: RE | Admit: 2024-11-15 | Discharge: 2024-11-15 | Disposition: A | Discharge status: Home / Self Care | Attending: Gastroenterology | Admitting: Gastroenterology

## 2024-11-15 ENCOUNTER — Encounter: Payer: Self-pay | Admitting: Gastroenterology

## 2024-11-15 DIAGNOSIS — K449 Diaphragmatic hernia without obstruction or gangrene: Secondary | ICD-10-CM | POA: Insufficient documentation

## 2024-11-15 DIAGNOSIS — K219 Gastro-esophageal reflux disease without esophagitis: Secondary | ICD-10-CM | POA: Insufficient documentation

## 2024-11-15 DIAGNOSIS — K317 Polyp of stomach and duodenum: Secondary | ICD-10-CM | POA: Diagnosis not present

## 2024-11-15 DIAGNOSIS — K642 Third degree hemorrhoids: Secondary | ICD-10-CM | POA: Diagnosis not present

## 2024-11-15 DIAGNOSIS — D124 Benign neoplasm of descending colon: Secondary | ICD-10-CM | POA: Diagnosis not present

## 2024-11-15 DIAGNOSIS — K5520 Angiodysplasia of colon without hemorrhage: Secondary | ICD-10-CM | POA: Diagnosis not present

## 2024-11-15 DIAGNOSIS — Z87891 Personal history of nicotine dependence: Secondary | ICD-10-CM | POA: Diagnosis not present

## 2024-11-15 DIAGNOSIS — K2970 Gastritis, unspecified, without bleeding: Secondary | ICD-10-CM | POA: Insufficient documentation

## 2024-11-15 DIAGNOSIS — Z1211 Encounter for screening for malignant neoplasm of colon: Secondary | ICD-10-CM | POA: Insufficient documentation

## 2024-11-15 DIAGNOSIS — I10 Essential (primary) hypertension: Secondary | ICD-10-CM | POA: Insufficient documentation

## 2024-11-15 HISTORY — DX: Drug-induced myopathy: T46.6X5A

## 2024-11-15 MED ORDER — GLYCOPYRROLATE 0.2 MG/ML IJ SOLN
INTRAMUSCULAR | Status: AC
  Filled 2024-11-15: qty 1

## 2024-11-15 MED ORDER — LIDOCAINE HCL (CARDIAC) PF 100 MG/5ML IV SOSY
PREFILLED_SYRINGE | INTRAVENOUS | Status: DC | PRN
  Administered 2024-11-15: 60 mg via INTRAVENOUS

## 2024-11-15 MED ORDER — GLYCOPYRROLATE 0.2 MG/ML IJ SOLN
INTRAMUSCULAR | Status: DC | PRN
  Administered 2024-11-15: .2 mg via INTRAVENOUS

## 2024-11-15 MED ORDER — PROPOFOL 500 MG/50ML IV EMUL
INTRAVENOUS | Status: DC | PRN
  Administered 2024-11-15: 75 ug/kg/min via INTRAVENOUS

## 2024-11-15 MED ORDER — PROPOFOL 10 MG/ML IV BOLUS
INTRAVENOUS | Status: DC | PRN
  Administered 2024-11-15 (×2): 50 mg via INTRAVENOUS

## 2024-11-15 MED ORDER — DEXMEDETOMIDINE HCL IN NACL 80 MCG/20ML IV SOLN
INTRAVENOUS | Status: DC | PRN
  Administered 2024-11-15: 12 ug via INTRAVENOUS
  Administered 2024-11-15: 8 ug via INTRAVENOUS

## 2024-11-15 MED ORDER — LIDOCAINE HCL (PF) 2 % IJ SOLN
INTRAMUSCULAR | Status: AC
  Filled 2024-11-15: qty 5

## 2024-11-15 MED ORDER — ONDANSETRON HCL 4 MG/2ML IJ SOLN
INTRAMUSCULAR | Status: AC
  Filled 2024-11-15: qty 2

## 2024-11-15 MED ORDER — SODIUM CHLORIDE 0.9 % IV SOLN: INTRAVENOUS | Status: DC

## 2024-11-15 NOTE — Op Note (Signed)
 [facility] Gastroenterology Patient Name: Lindsey Stevens Procedure Date: 11/15/2024 8:02 AM MRN: [id number] Account #: [id number] Date of Birth: Jun 10, 1951 Admit Type: Outpatient Age: 74 Room: [facility] ENDO ROOM 1 Gender: Female Note Status: Finalized Instrument Name: Endoscope [id number] Procedure:             Upper GI endoscopy Indications:           GERD Providers:             Elspeth Ozell Onita ROSALEA, DO Referring MD:          Reyes BIRCH. Auston, MD (Referring MD) Medicines:             Monitored Anesthesia Care Complications:         No immediate complications. Estimated blood loss:                         Minimal. Procedure:             Pre-Anesthesia Assessment:                        - Prior to the procedure, a History and Physical was                         performed, and patient medications and allergies were                         reviewed. The patient is competent. The risks and                         benefits of the procedure and the sedation options and                         risks were discussed with the patient. All questions                         were answered and informed consent was obtained.                         Patient identification and proposed procedure were                         verified by the physician, the nurse, the anesthetist                         and the technician in the endoscopy suite. Mental                         Status Examination: alert and oriented. Airway                         Examination: normal oropharyngeal airway and neck                         mobility. Respiratory Examination: clear to                         auscultation. CV Examination: RRR, no murmurs, no S3  or S4. Prophylactic Antibiotics: The patient does not                         require prophylactic antibiotics. Prior                         Anticoagulants: The patient has taken no anticoagulant                         or  antiplatelet agents. ASA Grade Assessment: II - A                         patient with mild systemic disease. After reviewing                         the risks and benefits, the patient was deemed in                         satisfactory condition to undergo the procedure. The                         anesthesia plan was to use monitored anesthesia care                         (MAC). Immediately prior to administration of                         medications, the patient was re-assessed for adequacy                         to receive sedatives. The heart rate, respiratory                         rate, oxygen saturations, blood pressure, adequacy of                         pulmonary ventilation, and response to care were                         monitored throughout the procedure. The physical                         status of the patient was re-assessed after the                         procedure.                        After obtaining informed consent, the endoscope was                         passed under direct vision. Throughout the procedure,                         the patient's blood pressure, pulse, and oxygen                         saturations were monitored continuously. The Endoscope  was introduced through the mouth, and advanced to the                         second part of duodenum. The upper GI endoscopy was                         accomplished without difficulty. The patient tolerated                         the procedure well. Findings:      The duodenal bulb, first portion of the duodenum and second portion of       the duodenum were normal. Estimated blood loss: none.      The Z-line was regular.      Esophagogastric landmarks were identified: the gastroesophageal junction       was found at 38 cm from the incisors.      The exam of the esophagus was otherwise normal.      A small hiatal hernia was present. Estimated blood loss: none.      Localized  moderate inflammation characterized by erythema and       granularity was found in the gastric antrum. Biopsies were taken with a       cold forceps for Helicobacter pylori testing. Estimated blood loss was       minimal.      Two 9 to 11 mm sessile polyps with no bleeding and stigmata of recent       bleeding were found on the greater curvature of the stomach and in the       prepyloric region of the stomach. The polyp was removed with a hot       snare. Resection and retrieval were complete. Madelyn net used to retrieve       the polyps. To prevent bleeding after the polypectomy, two hemostatic       clips were successfully placed (MR conditional). There was no bleeding       at the end of the procedure. one clip placed on each polypectomy site. Impression:            - Normal duodenal bulb, first portion of the duodenum                         and second portion of the duodenum.                        - Z-line regular.                        - Esophagogastric landmarks identified.                        - Small hiatal hernia.                        - Gastritis. Biopsied.                        - Two gastric polyps. Resected and retrieved. Clips                         (MR conditional) were placed. Recommendation:        - Patient has a  contact number available for                         emergencies. The signs and symptoms of potential                         delayed complications were discussed with the patient.                         Return to normal activities tomorrow. Written                         discharge instructions were provided to the patient.                        - Discharge patient to home.                        - Resume previous diet.                        - Continue present medications.                        - No ibuprofen, naproxen, or other non-steroidal                         anti-inflammatory drugs for 5 days after polyp removal.                        - Await  pathology results.                        - Repeat upper endoscopy for surveillance based on                         pathology results.                        - Return to GI office as previously scheduled.                        - The findings and recommendations were discussed with                         the patient. Procedure Code(s):     --- Professional ---                        [phone], Esophagogastroduodenoscopy, flexible,                         transoral; with removal of tumor(s), polyp(s), or                         other lesion(s) by snare technique                        43239, 59, Esophagogastroduodenoscopy, flexible,                         transoral; with biopsy, single or multiple Diagnosis Code(s):     ---  Professional ---                        K44.9, Diaphragmatic hernia without obstruction or                         gangrene                        K29.70, Gastritis, unspecified, without bleeding                        K31.7, Polyp of stomach and duodenum CPT copyright 2022 American Medical Association. All rights reserved. The codes documented in this report are preliminary and upon coder review may  be revised to meet current compliance requirements. Attending Participation:      I personally performed the entire procedure. Elspeth Jungling, DO Elspeth Ozell Jungling DO, DO 11/15/2024 8:39:47 AM This report has been signed electronically. Number of Addenda: 0 Note Initiated On: 11/15/2024 8:02 AM Estimated Blood Loss:  Estimated blood loss was minimal.      [facility]

## 2024-11-15 NOTE — Transfer of Care (Signed)
 Immediate Anesthesia Transfer of Care Note  Patient: Lindsey Stevens  Procedure(s) Performed: COLONOSCOPY EGD (ESOPHAGOGASTRODUODENOSCOPY) POLYPECTOMY, INTESTINE CONTROL OF HEMORRHAGE, GI TRACT, ENDOSCOPIC, BY CLIPPING OR OVERSEWING POLYPECTOMY, stomach  Patient Location: PACU  Anesthesia Type:General  Level of Consciousness: sedated  Airway & Oxygen Therapy: Patient Spontanous Breathing  Post-op Assessment: Report given to RN and Post -op Vital signs reviewed and stable  Post vital signs: Reviewed and stable  Last Vitals:  Vitals Value Taken Time  BP 96/51 11/15/24 08:57  Temp    Pulse 58 11/15/24 08:58  Resp 18 11/15/24 08:58  SpO2 91 % 11/15/24 08:58  Vitals shown include unfiled device data.  Last Pain:  Vitals:   11/15/24 0802  TempSrc: Temporal  PainSc: 0-No pain         Complications: No notable events documented.

## 2024-11-15 NOTE — H&P (Signed)
 "  Pre-Procedure H&P   Patient ID: Lindsey Stevens is a 74 y.o. female.  Gastroenterology Provider: Elspeth Ozell Jungling, DO  Referring Provider: Romero Antigua, PA PCP: Auston Reyes BIRCH, MD  Date: 11/15/2024  HPI Ms. Lindsey Stevens is a 74 y.o. female who presents today for Esophagogastroduodenoscopy and Colonoscopy for GERD, personal history of colon polyps .  Patient notes worsening GERD despite increasing PPI.  Denies any dysphagia or odynophagia  Personal history of colon polyps including a 10 mm tubular adenoma in 2016.  Last colonoscopy in 2020 with internal hemorrhoids.  No other reported GI symptoms   Past Medical History:  Diagnosis Date   Dysplastic nevus 01/24/2018   R inf med knee - mod   Dysrhythmia    PVCs   Gallstone pancreatitis    GERD (gastroesophageal reflux disease)    Heart murmur    Hyperlipidemia    Hypertension    Nephrolithiasis    Obesity    Optic neuropathy    Rosacea    Statin myopathy    Tuberculosis 1973   treated    Past Surgical History:  Procedure Laterality Date   ABDOMINAL HYSTERECTOMY     CATARACT EXTRACTION W/PHACO Right 11/07/2016   Procedure: CATARACT EXTRACTION PHACO AND INTRAOCULAR LENS PLACEMENT (IOC)  Right;  Surgeon: Donzell Arlyce Budd, MD;  Location: [facility];  Service: Ophthalmology;  Laterality: Right;  right eye IVA topical   CATARACT EXTRACTION W/PHACO Left 05/08/2017   Procedure: CATARACT EXTRACTION PHACO AND INTRAOCULAR LENS PLACEMENT (IOC)  Left;  Surgeon: Myrna Adine Anes, MD;  Location: [facility];  Service: Ophthalmology;  Laterality: Left;   CESAREAN SECTION     x3   CHOLECYSTECTOMY     COLONOSCOPY WITH PROPOFOL  N/A 08/31/2015   Procedure: COLONOSCOPY WITH PROPOFOL ;  Surgeon: Lamar ONEIDA Holmes, MD;  Location: [facility];  Service: Endoscopy;  Laterality: N/A;   COLONOSCOPY WITH PROPOFOL  N/A 10/22/2018   Procedure: COLONOSCOPY WITH PROPOFOL ;  Surgeon: Holmes Lamar ONEIDA, MD;  Location:  [facility] END[facility];  Service: Endoscopy;  Laterality: N/A;   ESOPHAGOGASTRODUODENOSCOPY (EGD) WITH PROPOFOL  N/A 08/31/2015   Procedure: ESOPHAGOGASTRODUODENOSCOPY (EGD) WITH PROPOFOL ;  Surgeon: Lamar ONEIDA Holmes, MD;  Location: [facility] END[facility]COPY;  Service: Endoscopy;  Laterality: N/A;   EYE MUSCLE SURGERY     as child   OOPHORECTOMY      Family History No h/o GI disease or malignancy  Review of Systems  Constitutional:  Negative for activity change, appetite change, chills, diaphoresis, fatigue, fever and unexpected weight change.  HENT:  Negative for trouble swallowing and voice change.   Respiratory:  Negative for shortness of breath and wheezing.   Cardiovascular:  Negative for chest pain, palpitations and leg swelling.  Gastrointestinal:  Negative for abdominal distention, abdominal pain, anal bleeding, blood in stool, constipation, diarrhea, nausea, rectal pain and vomiting.  Musculoskeletal:  Negative for arthralgias and myalgias.  Skin:  Negative for color change and pallor.  Neurological:  Negative for dizziness, syncope and weakness.  Psychiatric/Behavioral:  Negative for confusion.   All other systems reviewed and are negative.    Medications Medications Ordered Prior to Encounter[1]  Pertinent medications related to GI and procedure were reviewed by me with the patient prior to the procedure  Current Medications[2]  sodium chloride          Allergies[3] Allergies were reviewed by me prior to the procedure  Objective   Body mass index is 41.63 kg/m. Vitals:   11/15/24 0802  BP: (!) 160/82  Pulse: (!) 59  Resp: 18  Temp: (!) 97.3 F (36.3 C)  TempSrc: Temporal  SpO2: 98%  Weight: 106.6 kg  Height: 5' 3 (1.6 m)     Physical Exam Vitals and nursing note reviewed.  Constitutional:      General: She is not in acute distress.    Appearance: Normal appearance. She is not ill-appearing, toxic-appearing or diaphoretic.  HENT:     Head: Normocephalic and  atraumatic.     Nose: Nose normal.     Mouth/Throat:     Mouth: Mucous membranes are moist.     Pharynx: Oropharynx is clear.  Eyes:     General: No scleral icterus.    Extraocular Movements: Extraocular movements intact.  Cardiovascular:     Rate and Rhythm: Regular rhythm. Bradycardia present.     Heart sounds: Normal heart sounds. No murmur heard.    No friction rub. No gallop.  Pulmonary:     Effort: Pulmonary effort is normal. No respiratory distress.     Breath sounds: Normal breath sounds. No wheezing, rhonchi or rales.  Abdominal:     General: Bowel sounds are normal. There is no distension.     Palpations: Abdomen is soft.     Tenderness: There is no abdominal tenderness. There is no guarding or rebound.  Musculoskeletal:     Cervical back: Neck supple.     Right lower leg: No edema.     Left lower leg: No edema.  Skin:    General: Skin is warm and dry.     Coloration: Skin is not jaundiced or pale.  Neurological:     General: No focal deficit present.     Mental Status: She is alert and oriented to person, place, and time. Mental status is at baseline.  Psychiatric:        Mood and Affect: Mood normal.        Behavior: Behavior normal.        Thought Content: Thought content normal.        Judgment: Judgment normal.      Assessment:  Ms. Lindsey Stevens is a 74 y.o. female  who presents today for Esophagogastroduodenoscopy and Colonoscopy for GERD, personal history of colon polyps .  Plan:  Esophagogastroduodenoscopy and Colonoscopy with possible intervention today  Esophagogastroduodenoscopy and Colonoscopy with possible biopsy, control of bleeding, polypectomy, and interventions as necessary has been discussed with the patient/patient representative. Informed consent was obtained from the patient/patient representative after explaining the indication, nature, and risks of the procedure including but not limited to death, bleeding, perforation, missed  neoplasm/lesions, cardiorespiratory compromise, and reaction to medications. Opportunity for questions was given and appropriate answers were provided. Patient/patient representative has verbalized understanding is amenable to undergoing the procedure.   Elspeth Ozell Jungling, DO  [facility]  Portions of the record may have been created with voice recognition software. Occasional wrong-word or 'sound-a-like' substitutions may have occurred due to the inherent limitations of voice recognition software.  Read the chart carefully and recognize, using context, where substitutions may have occurred.     [1]  No current facility-administered medications on file prior to encounter.   Current Outpatient Medications on File Prior to Encounter  Medication Sig Dispense Refill   amLODipine (NORVASC) 5 MG tablet Take 5 mg by mouth daily.     aspirin EC 81 MG tablet Take 81 mg by mouth daily. Swallow whole.     lisinopril (PRINIVIL,ZESTRIL) 20 MG tablet Take 20 mg by  mouth daily.     metoprolol succinate (TOPROL-XL) 25 MG 24 hr tablet      pantoprazole (PROTONIX) 40 MG tablet Take 40 mg by mouth daily.     sertraline (ZOLOFT) 50 MG tablet Take 50 mg by mouth at bedtime.     Adapalene  (DIFFERIN ) 0.3 % gel Apply 1 Application topically at bedtime. Qhs to face 45 g 11   Azelaic Acid  (FINACEA ) 15 % cream After skin is thoroughly washed and patted dry, gently but thoroughly massage a thin film of azelaic acid  cream into the affected area twice daily, in the morning and evening. 30 g 11   Calcium Carbonate-Vitamin D 500-125 MG-UNIT TABS Take by mouth.     cetirizine (ZYRTEC) 10 MG tablet Take 10 mg by mouth daily.     cholestyramine light (PREVALITE) 4 GM/DOSE powder Take by mouth.     Dapsone 5 % topical gel Apply topically as needed.     fluticasone (FLONASE) 50 MCG/ACT nasal spray Place into both nostrils daily as needed for allergies or rhinitis.     Fluticasone-Salmeterol (ADVAIR)  250-50 MCG/DOSE AEPB Inhale into the lungs.     Ivermectin  1 % CREA Apply 1 Application topically at bedtime. 30 g 11   nystatin (MYCOSTATIN) 100000 UNIT/ML suspension Take 5 mLs by mouth 4 (four) times daily.     Polyethyl Glycol-Propyl Glycol (SYSTANE OP) Apply to eye as needed.     sodium chloride  (OCEAN) 0.65 % SOLN nasal spray Place 1 spray into both nostrils as needed for congestion.     valACYclovir  (VALTREX ) 500 MG tablet TAKE 1 TABLET(500 MG) BY MOUTH TWICE DAILY FOR 7 DAYS STARTING 1 DAY BEFORE PROCEDURE 14 tablet 2  [2]  Current Facility-Administered Medications:    0.9 %  sodium chloride  infusion, , Intravenous, Continuous, Onita Elspeth Sharper, DO [3]  Allergies Allergen Reactions   Formaldehyde Swelling    Face swelling   Bactrim [Sulfamethoxazole-Trimethoprim] Hives   Hydrochlorothiazide Itching   Sulfa Antibiotics Rash   "

## 2024-11-15 NOTE — Anesthesia Postprocedure Evaluation (Signed)
"   Anesthesia Post Note  Patient: Lindsey Stevens  Procedure(s) Performed: COLONOSCOPY EGD (ESOPHAGOGASTRODUODENOSCOPY) POLYPECTOMY, INTESTINE CONTROL OF HEMORRHAGE, GI TRACT, ENDOSCOPIC, BY CLIPPING OR OVERSEWING POLYPECTOMY, stomach  Patient location during evaluation: PACU Anesthesia Type: General Level of consciousness: awake and alert Pain management: pain level controlled Vital Signs Assessment: post-procedure vital signs reviewed and stable Respiratory status: spontaneous breathing, nonlabored ventilation, respiratory function stable and patient connected to nasal cannula oxygen Cardiovascular status: blood pressure returned to baseline and stable Postop Assessment: no apparent nausea or vomiting Anesthetic complications: no   No notable events documented.   Last Vitals:  Vitals:   11/15/24 0802 11/15/24 0857  BP: (!) 160/82 (!) 96/51  Pulse: (!) 59 61  Resp: 18 14  Temp: (!) 36.3 C   SpO2: 98% 98%    Last Pain:  Vitals:   11/15/24 0802  TempSrc: Temporal  PainSc: 0-No pain                 Fairy A Creta Dorame      "

## 2024-11-15 NOTE — Interval H&P Note (Signed)
 History and Physical Interval Note: Preprocedure H&P from 11/15/24  was reviewed and there was no interval change after seeing and examining the patient.  Written consent was obtained from the patient after discussion of risks, benefits, and alternatives. Patient has consented to proceed with Esophagogastroduodenoscopy and Colonoscopy with possible intervention   11/15/2024 8:09 AM  Lindsey Stevens  has presented today for surgery, with the diagnosis of Gastroesophageal reflux disease, unspecified whether esophagitis present (K21.9) Personal history of adenomatous and serrated colon polyps (Z86.0101).  The various methods of treatment have been discussed with the patient and family. After consideration of risks, benefits and other options for treatment, the patient has consented to  Procedures: COLONOSCOPY (N/A) EGD (ESOPHAGOGASTRODUODENOSCOPY) (N/A) as a surgical intervention.  The patient's history has been reviewed, patient examined, no change in status, stable for surgery.  I have reviewed the patient's chart and labs.  Questions were answered to the patient's satisfaction.     Elspeth Ozell Jungling

## 2024-11-15 NOTE — Anesthesia Preprocedure Evaluation (Signed)
"                                    Anesthesia Evaluation  Patient identified by MRN, date of birth, ID band Patient awake    Reviewed: Allergy & Precautions, H&P , NPO status , Patient's Chart, lab work & pertinent test results  Airway Mallampati: II  TM Distance: >3 FB Neck ROM: Full    Dental no notable dental hx.    Pulmonary neg pulmonary ROS, former smoker   Pulmonary exam normal breath sounds clear to auscultation       Cardiovascular hypertension, negative cardio ROS Normal cardiovascular exam Rhythm:Regular Rate:Normal     Neuro/Psych negative neurological ROS  negative psych ROS   GI/Hepatic negative GI ROS, Neg liver ROS,,,  Endo/Other  negative endocrine ROS    Renal/GU negative Renal ROS  negative genitourinary   Musculoskeletal negative musculoskeletal ROS (+)    Abdominal   Peds negative pediatric ROS (+)  Hematology negative hematology ROS (+)   Anesthesia Other Findings   Reproductive/Obstetrics negative OB ROS                              Anesthesia Physical Anesthesia Plan  ASA: 2  Anesthesia Plan: General   Post-op Pain Management:    Induction: Intravenous  PONV Risk Score and Plan:   Airway Management Planned:   Additional Equipment:   Intra-op Plan:   Post-operative Plan: Extubation in OR  Informed Consent: I have reviewed the patients History and Physical, chart, labs and discussed the procedure including the risks, benefits and alternatives for the proposed anesthesia with the patient or authorized representative who has indicated his/her understanding and acceptance.     Dental advisory given  Plan Discussed with: CRNA  Anesthesia Plan Comments:         Anesthesia Quick Evaluation  "

## 2024-11-18 LAB — SURGICAL PATHOLOGY

## 2025-01-07 ENCOUNTER — Ambulatory Visit: Admitting: Dermatology
